# Patient Record
Sex: Male | Born: 1985 | Race: Black or African American | Hispanic: No | Marital: Single | Smoking: Current some day smoker
Health system: Southern US, Community
[De-identification: ages and names within clinical notes are randomized; demographics above are authoritative.]

## PROBLEM LIST (undated history)

## (undated) DIAGNOSIS — G40909 Epilepsy, unspecified, not intractable, without status epilepticus: Secondary | ICD-10-CM

## (undated) DIAGNOSIS — R569 Unspecified convulsions: Secondary | ICD-10-CM

## (undated) DIAGNOSIS — J45909 Unspecified asthma, uncomplicated: Secondary | ICD-10-CM

## (undated) HISTORY — DX: Epilepsy, unspecified, not intractable, without status epilepticus: G40.909

## (undated) HISTORY — PX: TONSILLECTOMY: SUR1361

## (undated) HISTORY — DX: Unspecified asthma, uncomplicated: J45.909

---
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
## 2003-06-02 ENCOUNTER — Emergency Department (HOSPITAL_COMMUNITY): Admission: EM | Admit: 2003-06-02 | Discharge: 2003-06-02 | Payer: Self-pay | Admitting: Emergency Medicine

## 2005-12-06 ENCOUNTER — Emergency Department (HOSPITAL_COMMUNITY): Admission: EM | Admit: 2005-12-06 | Discharge: 2005-12-06 | Payer: Self-pay | Admitting: Emergency Medicine

## 2007-04-18 ENCOUNTER — Emergency Department (HOSPITAL_COMMUNITY): Admission: EM | Admit: 2007-04-18 | Discharge: 2007-04-18 | Payer: Self-pay | Admitting: Emergency Medicine

## 2007-09-23 ENCOUNTER — Emergency Department (HOSPITAL_COMMUNITY): Admission: EM | Admit: 2007-09-23 | Discharge: 2007-09-23 | Payer: Self-pay | Admitting: Emergency Medicine

## 2007-10-04 ENCOUNTER — Emergency Department (HOSPITAL_COMMUNITY): Admission: EM | Admit: 2007-10-04 | Discharge: 2007-10-04 | Payer: Self-pay | Admitting: Emergency Medicine

## 2008-05-17 ENCOUNTER — Emergency Department (HOSPITAL_COMMUNITY): Admission: EM | Admit: 2008-05-17 | Discharge: 2008-05-17 | Payer: Self-pay | Admitting: Emergency Medicine

## 2009-07-02 ENCOUNTER — Emergency Department (HOSPITAL_COMMUNITY): Admission: EM | Admit: 2009-07-02 | Discharge: 2009-07-02 | Payer: Self-pay | Admitting: Emergency Medicine

## 2009-09-23 ENCOUNTER — Emergency Department (HOSPITAL_COMMUNITY): Admission: EM | Admit: 2009-09-23 | Discharge: 2009-09-23 | Payer: Self-pay | Admitting: Emergency Medicine

## 2010-01-06 ENCOUNTER — Emergency Department (HOSPITAL_COMMUNITY): Admission: EM | Admit: 2010-01-06 | Discharge: 2010-01-06 | Payer: Self-pay | Admitting: Emergency Medicine

## 2010-02-21 ENCOUNTER — Emergency Department (HOSPITAL_COMMUNITY): Admission: EM | Admit: 2010-02-21 | Discharge: 2010-02-21 | Payer: Self-pay | Admitting: Emergency Medicine

## 2010-08-27 ENCOUNTER — Emergency Department (HOSPITAL_COMMUNITY): Admission: EM | Admit: 2010-08-27 | Discharge: 2010-08-27 | Payer: Self-pay | Admitting: Specialist

## 2011-01-27 LAB — CBC
HCT: 41.3 % (ref 39.0–52.0)
Hemoglobin: 14.1 g/dL (ref 13.0–17.0)
MCHC: 34.2 g/dL (ref 30.0–36.0)
MCV: 91.5 fL (ref 78.0–100.0)
Platelets: 141 10*3/uL — ABNORMAL LOW (ref 150–400)
RBC: 4.52 MIL/uL (ref 4.22–5.81)
RDW: 13.2 % (ref 11.5–15.5)
WBC: 4 10*3/uL (ref 4.0–10.5)

## 2011-01-27 LAB — BASIC METABOLIC PANEL
BUN: 12 mg/dL (ref 6–23)
CO2: 28 mEq/L (ref 19–32)
Calcium: 9.1 mg/dL (ref 8.4–10.5)
Chloride: 104 mEq/L (ref 96–112)
Creatinine, Ser: 0.75 mg/dL (ref 0.4–1.5)
GFR calc Af Amer: >60 mL/min (ref >60)
GFR calc non Af Amer: >60 mL/min (ref >60)
Glucose, Bld: 100 mg/dL — ABNORMAL HIGH (ref 70–99)
Potassium: 4.7 mEq/L (ref 3.5–5.1)
Sodium: 137 mEq/L (ref 135–145)

## 2011-01-27 LAB — DIFFERENTIAL
Basophils Absolute: 0 10*3/uL (ref 0.0–0.1)
Basophils Relative: 1 % (ref 0–1)
Eosinophils Absolute: 0 10*3/uL (ref 0.0–0.7)
Eosinophils Relative: 1 % (ref 0–5)
Lymphocytes Relative: 44 % (ref 12–46)
Lymphs Abs: 1.7 10*3/uL (ref 0.7–4.0)
Monocytes Absolute: 0.6 10*3/uL (ref 0.1–1.0)
Monocytes Relative: 14 % — ABNORMAL HIGH (ref 3–12)
Neutro Abs: 1.6 10*3/uL — ABNORMAL LOW (ref 1.7–7.7)
Neutrophils Relative %: 41 % — ABNORMAL LOW (ref 43–77)

## 2011-01-27 LAB — VALPROIC ACID LEVEL: Valproic Acid Lvl: <10 ug/mL — ABNORMAL LOW (ref 50.0–100.0)

## 2011-02-11 LAB — VALPROIC ACID LEVEL: Valproic Acid Lvl: 61.8 ug/mL (ref 50.0–100.0)

## 2011-08-03 LAB — VALPROIC ACID LEVEL: Valproic Acid Lvl: <10 — ABNORMAL LOW

## 2011-08-03 LAB — POCT I-STAT, CHEM 8
HCT: 46
Hemoglobin: 15.6
Potassium: 4.1
Sodium: 141
TCO2: 26

## 2011-08-15 LAB — I-STAT 8, (EC8 V) (CONVERTED LAB)
Acid-base deficit: 1
Bicarbonate: 25.7 — ABNORMAL HIGH
HCT: 44
Operator id: 234501
pCO2, Ven: 47.4
pH, Ven: 7.341 — ABNORMAL HIGH

## 2011-08-15 LAB — POCT I-STAT CREATININE: Creatinine, Ser: 0.9

## 2011-08-15 LAB — VALPROIC ACID LEVEL: Valproic Acid Lvl: 88.5

## 2011-08-24 LAB — I-STAT 8, (EC8 V) (CONVERTED LAB)
Acid-base deficit: 1
Chloride: 106
HCT: 47
Operator id: 234111
Potassium: 3.9
TCO2: 28
pCO2, Ven: 52.6 — ABNORMAL HIGH
pH, Ven: 7.311 — ABNORMAL HIGH

## 2011-08-24 LAB — POCT I-STAT CREATININE
Creatinine, Ser: 0.9
Operator id: 234111

## 2012-01-26 ENCOUNTER — Emergency Department (HOSPITAL_COMMUNITY)
Admission: EM | Admit: 2012-01-26 | Discharge: 2012-01-26 | Disposition: A | Discharge status: Home / Self Care | Payer: Self-pay | Attending: Emergency Medicine | Admitting: Emergency Medicine

## 2012-01-26 ENCOUNTER — Encounter (HOSPITAL_COMMUNITY): Payer: Self-pay | Admitting: Emergency Medicine

## 2012-01-26 DIAGNOSIS — R569 Unspecified convulsions: Secondary | ICD-10-CM | POA: Insufficient documentation

## 2012-01-26 DIAGNOSIS — F29 Unspecified psychosis not due to a substance or known physiological condition: Secondary | ICD-10-CM | POA: Insufficient documentation

## 2012-01-26 DIAGNOSIS — F17200 Nicotine dependence, unspecified, uncomplicated: Secondary | ICD-10-CM | POA: Insufficient documentation

## 2012-01-26 LAB — VALPROIC ACID LEVEL: Valproic Acid Lvl: 57.2 ug/mL (ref 50.0–100.0)

## 2012-01-26 NOTE — ED Provider Notes (Signed)
History     CSN: [id number]  Arrival date & time 01/26/12  1230   First MD Initiated Contact with Patient 01/26/12 1305      Chief Complaint  Patient presents with  . 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 Witness states patient had head jerking which generalized into full body shaking. Witness stated patient had two seizures separated by one minute. + postictal state. No urinary or bowel incontinence. Last seizure two years ago. Takes depakote daily and has been taking. Patient drinks occasional ETOH, two beers last night. No injuries or other medical complaints.   Patient is a 26 y.o. male presenting with seizures. The history is provided by the patient.  Seizures  This is a recurrent problem. The current episode started less than 1 hour ago. There were 2 to 3 seizures. The most recent episode lasted 2 to 5 minutes. Associated symptoms include confusion. Pertinent negatives include no headaches, no neck stiffness, no sore throat, no chest pain, no cough, no nausea, no vomiting and no diarrhea. Characteristics include eye blinking, rhythmic jerking and loss of consciousness. Characteristics do not include bladder incontinence or bit tongue. The episode was witnessed. The seizures did not continue in the ED. There has been no fever.    Past Medical History  Diagnosis Date  . Seizures     Past Surgical History  Procedure Date  . Tonsillectomy     History reviewed. No pertinent family history.  History  Substance Use Topics  . Smoking status: Current Everyday Smoker -- 0.5 packs/day    Types: Cigarettes  . Smokeless tobacco: Not on file  . Alcohol Use:      socially      Review of Systems  Constitutional: Negative for fever.  HENT: Negative for sore throat and rhinorrhea.     Eyes: Negative for redness.  Respiratory: Negative for cough.   Cardiovascular: Negative for chest pain.  Gastrointestinal: Negative for nausea, vomiting, abdominal pain and diarrhea.  Genitourinary: Negative for bladder incontinence and dysuria.  Musculoskeletal: Negative for myalgias.  Skin: Negative for rash.  Neurological: Positive for seizures and loss of consciousness. Negative for headaches.  Psychiatric/Behavioral: Positive for confusion.    Allergies  Review of patient's allergies indicates no known allergies.  Home Medications   Current Outpatient Rx  Name Route Sig Dispense Refill  . DIVALPROEX SODIUM ER 500 MG PO TB24 Oral Take 2,000 mg by mouth daily.      BP 143/81  Pulse 105  Temp(Src) 98.6 F (37 C) (Oral)  Resp 21  SpO2 96%  Physical Exam  Nursing note and vitals reviewed. Constitutional: He is oriented to person, place, and time. He appears well-developed and well-nourished.  HENT:  Head: Normocephalic and atraumatic.  Eyes: Conjunctivae are normal. Right eye exhibits no discharge. Left eye exhibits no discharge.  Neck: Normal range of motion. Neck supple.  Cardiovascular: Normal rate, regular rhythm and normal heart sounds.   Pulmonary/Chest: Effort normal and breath sounds normal.  Abdominal: Soft. There is no tenderness.  Neurological: He is alert and oriented to person, place, and time. He has normal strength. No cranial nerve deficit or sensory deficit. Coordination normal. GCS eye subscore is 4. GCS verbal subscore is 5. GCS motor subscore is 6.  Skin: Skin is warm and dry.  Psychiatric: He has a normal mood and affect.    ED  Course  Procedures (including critical care time)   Labs Reviewed  VALPROIC ACID LEVEL  VALPROIC ACID LEVEL   No results found.   1. Seizure     1:00PM Patient seen and examined. Work-up initiated.    Vital signs reviewed and are as follows: Filed Vitals:   01/26/12 1238  BP: 143/81  Pulse: 105  Temp: 98.6  F (37 C)  Resp: 21   3:09 PM Patient resting in room. No additional seizure activity.   6:42 PM Depakote level is normal. Patient informed. There was a delay in test result because first sample was hemolyzed. Patient instructed to take depakote as he normally does. Urged neurologist follow-up. Instructed not to drive.   MDM  Patient with seizure disorder. Typical seizure. No head injury. Depakote level normal. Discharge in stable condition.         [place], [place] 01/26/12 [phone]

## 2012-01-26 NOTE — Progress Notes (Signed)
not interested in [facility] service prefers to have neurologist as pcp

## 2012-01-26 NOTE — Discharge Instructions (Signed)
Please read and follow all provided instructions.  Your diagnoses today include:  1. Seizure     Tests performed today include:  Depakote level - showed normal depakote level  Vital signs. See below for your results today.   Medications prescribed:   None  Take any prescribed medications only as directed.  Home care instructions:  Follow any educational materials contained in this packet.  BE VERY CAREFUL not to take multiple medicines containing Tylenol (also called acetaminophen). Doing so can lead to an overdose which can damage your liver and cause liver failure and possibly death.   Follow-up instructions: Please follow-up with your primary care provider in the next 1 week for further evaluation of your symptoms. If you do not have a primary care doctor -- see below for referral information.   Return instructions:   Please return to the Emergency Department if you experience worsening symptoms.   Please return if you have any other emergent concerns.  Additional Information:  Your vital signs today were: BP 143/81  Pulse 105  Temp(Src) 98.6 F (37 C) (Oral)  Resp 21  SpO2 96% If your blood pressure (BP) was elevated above 135/85 this visit, please have this repeated by your doctor within one month. -------------- No Primary Care Doctor Call [facility]  [phone] Other agencies that provide inexpensive medical care    [facility]  [phone]    [facility]  [phone]    [facility]  [phone]    [facility]  [phone]    [facility]  [phone]    [facility]  [phone] -------------- RESOURCE GUIDE:  Dental Problems  Patients with Medicaid: [facility] [phone]                                            [phone] [address] [employer] [address] Phone:  [phone]                                                   Phone:  [phone]  If unable to pay or  uninsured, contact:  [facility] or [facility]. to become qualified for the adult dental clinic.  Chronic Pain Problems Contact [facility]  [phone] Patients need to be referred by their primary care doctor.  Insufficient Money for Medicine Contact [employer]:  call "211" or [facility] [phone].  Psychological Services [facility]  [phone] [facility]  [phone] [facility]   [phone] (emergency services [phone])  Substance Abuse Resources [facility]  [phone] [facility] [phone] [employer] [phone] Floydene Flock [phone] Residential & Outpatient Substance Abuse Program  [phone]  Abuse/Neglect [facility] Child Abuse Hotline [phone] 620-7387 [facility] Child Abuse Hotline [phone] 417-6701 (After Hours)  Emergency Shelter [facility] [phone]  Maternity Homes Room at the [place] [facility] [place] [phone] [facility] [phone]  [facility]  [facility] [address] [place]                       [address]      [address]  [place]                                                [place]                            [place] Phone:  [phone]                                   Phone:  [phone]                 Phone:  [phone]  [facility] Phone:  [phone]  [place] Child Abuse Hotline [phone]) 342-1394 [phone] (After Hours)    

## 2012-01-26 NOTE — ED Notes (Signed)
CALLED [facility] TO CHECK ON VALPORIC ACID LEVEL

## 2012-01-26 NOTE — Progress Notes (Signed)
Pt states he primarily sees his neurologist in [place] Having difficulty recalling the name but believes it is Dr Lalla Brothers he does not have a pcp

## 2012-01-26 NOTE — ED Notes (Signed)
To ed via [facility] with c/o seizure x2 this am. Hx of seizures since 26y/o. (22 yrs ago) from head injury. Last seizure 2 yrs ago.

## 2012-01-26 NOTE — ED Notes (Signed)
AVW:[id number]<XB> Expected date:<BR> Expected time:12:13 PM<BR> Means of arrival:<BR> Comments:<BR> M100 - 26yoM Seizure with hx

## 2012-01-29 NOTE — ED Provider Notes (Signed)
Medical screening examination/treatment/procedure(s) were performed by non-physician practitioner and as supervising physician I was immediately available for consultation/collaboration.   Benny Lennert, MD 01/29/12 1113

## 2013-01-22 ENCOUNTER — Encounter (HOSPITAL_COMMUNITY): Payer: Self-pay | Admitting: *Deleted

## 2013-01-22 ENCOUNTER — Emergency Department (HOSPITAL_COMMUNITY)
Admission: EM | Admit: 2013-01-22 | Discharge: 2013-01-22 | Disposition: A | Discharge status: Home / Self Care | Payer: Self-pay | Attending: Emergency Medicine | Admitting: Emergency Medicine

## 2013-01-22 DIAGNOSIS — J069 Acute upper respiratory infection, unspecified: Secondary | ICD-10-CM | POA: Insufficient documentation

## 2013-01-22 DIAGNOSIS — G40909 Epilepsy, unspecified, not intractable, without status epilepticus: Secondary | ICD-10-CM | POA: Insufficient documentation

## 2013-01-22 DIAGNOSIS — F17200 Nicotine dependence, unspecified, uncomplicated: Secondary | ICD-10-CM | POA: Insufficient documentation

## 2013-01-22 DIAGNOSIS — J3489 Other specified disorders of nose and nasal sinuses: Secondary | ICD-10-CM | POA: Insufficient documentation

## 2013-01-22 DIAGNOSIS — IMO0001 Reserved for inherently not codable concepts without codable children: Secondary | ICD-10-CM | POA: Insufficient documentation

## 2013-01-22 NOTE — ED Notes (Signed)
Pt states has a sore throat, body aches, and congestion/"I can't breathe". Pt states he doesn't know if he's had a fever.

## 2013-01-22 NOTE — ED Provider Notes (Signed)
History    This chart was scribed for non-physician practitioner working with Celene Kras, MD by Leone Payor, ED Scribe. This patient was seen in room WTR6/WTR6 and the patient's care was started at 1832.   CSN: [id number]  Arrival date & time 01/22/13  7829   First MD Initiated Contact with Patient 01/22/13 1850      Chief Complaint  Patient presents with  . Sore Throat  . Generalized Body Aches  . Nasal Congestion    The history is provided by the patient. No language interpreter was used.    Donald Travis is a 27 y.o. male who presents to the Emergency Department complaining of new, gradual onset, gradual worsening sore throat with associated body aches, cough and congestion starting 1 day ago. Pt states he does not know what color mucous he is coughing up. He denies taking any OTC medications at home. States he is drinking liquids less because it worsens his sore throat. He does not know of any sick contacts. He denies abdominal, nausea, chest pain, SOB, chills, ear pain. Pt takes Depakote daily.   Pt is a current everyday smoker and occasional alcohol user.  Past Medical History  Diagnosis Date  . Seizures     Past Surgical History  Procedure Laterality Date  . Tonsillectomy      History reviewed. No pertinent family history.  History  Substance Use Topics  . Smoking status: Current Some Day Smoker -- 0.50 packs/day    Types: Cigarettes  . Smokeless tobacco: Never Used  . Alcohol Use: Yes     Comment: socially      Review of Systems A complete 10 system review of systems was obtained and all systems are negative except as noted in the HPI and PMH.    Allergies  Review of patient's allergies indicates no known allergies.  Home Medications   Current Outpatient Rx  Name  Route  Sig  Dispense  Refill  . divalproex (DEPAKOTE ER) 500 MG 24 hr tablet   Oral   Take 2,000 mg by mouth daily.         . Multiple Vitamin (MULTIVITAMIN WITH MINERALS) TABS    Oral   Take 1 tablet by mouth daily.           BP 128/82  Pulse 100  Temp(Src) 98.6 F (37 C) (Oral)  Resp 23  SpO2 98%  Physical Exam  Nursing note and vitals reviewed. Constitutional: He is oriented to person, place, and time. He appears well-developed and well-nourished. No distress.  HENT:  Head: Normocephalic and atraumatic.  Right Ear: Tympanic membrane and external ear normal.  Left Ear: Tympanic membrane and external ear normal.  Nose: Mucosal edema present.  Mouth/Throat: Oropharynx is clear and moist. No oropharyngeal exudate.  Eyes: EOM are normal.  Neck: Neck supple. No tracheal deviation present.  Cardiovascular: Normal rate, regular rhythm and normal heart sounds.   Pulmonary/Chest: Effort normal and breath sounds normal. No respiratory distress. He has no wheezes.  Abdominal: Soft. Bowel sounds are normal. He exhibits no distension and no mass. There is no tenderness. There is no rebound and no guarding.  Musculoskeletal: Normal range of motion.  Lymphadenopathy:    He has no cervical adenopathy.  Neurological: He is alert and oriented to person, place, and time.  Skin: Skin is warm and dry.  Psychiatric: He has a normal mood and affect. His behavior is normal.    ED Course  Procedures (including critical care time)  DIAGNOSTIC STUDIES: Oxygen Saturation is 98% on room air, normal by my interpretation.    COORDINATION OF CARE: 6:55 PM Discussed treatment plan with pt at bedside and pt agreed to plan.    Labs Reviewed - No data to display No results found.   1. URI (upper respiratory infection)       MDM  Cough with nasal congestion and sore throat. No dyspnea.  No fever, no n/v/d, abdominal pain.  Pt has not tried anything for symptom relief.    Nasal membranes: erythremic, clear bilateral nasal discharge   Lungs: CTAB   Not concerned for pneumonia or bronchitis at this time.   Believe this is viral URI.   Instructed pt to try some OTC  tylenol cold and sinus with nasal saline rinses.  May use cough drops to sooth sore throat.  F/u with PCP or urgent care if not improving.  Return to ED if difficulty breathing, develop fever uncontrolled by medications.   Vitals: unremarkable. Discharged in stable condition.    Discussed pt with supervisore during ED encounter.  I personally performed the services described in this documentation, which was scribed in my presence. The recorded information has been reviewed and is accurate.   Junius Finner, PA-C 01/22/13 1932

## 2013-01-22 NOTE — ED Provider Notes (Signed)
Medical screening examination/treatment/procedure(s) were performed by non-physician practitioner and as supervising physician I was immediately available for consultation/collaboration.   Celene Kras, MD 01/22/13 [phone]

## 2014-09-28 ENCOUNTER — Emergency Department (HOSPITAL_COMMUNITY)
Admission: EM | Admit: 2014-09-28 | Discharge: 2014-09-29 | Disposition: A | Discharge status: Home / Self Care | Payer: Self-pay | Attending: Emergency Medicine | Admitting: Emergency Medicine

## 2014-09-28 ENCOUNTER — Encounter (HOSPITAL_COMMUNITY): Payer: Self-pay | Admitting: Emergency Medicine

## 2014-09-28 DIAGNOSIS — Z720 Tobacco use: Secondary | ICD-10-CM | POA: Insufficient documentation

## 2014-09-28 DIAGNOSIS — G40909 Epilepsy, unspecified, not intractable, without status epilepticus: Secondary | ICD-10-CM | POA: Insufficient documentation

## 2014-09-28 DIAGNOSIS — R569 Unspecified convulsions: Secondary | ICD-10-CM

## 2014-09-28 DIAGNOSIS — Z79899 Other long term (current) drug therapy: Secondary | ICD-10-CM | POA: Insufficient documentation

## 2014-09-28 LAB — BASIC METABOLIC PANEL
ANION GAP: 19 — AB (ref 5–15)
BUN: 14 mg/dL (ref 6–23)
CALCIUM: 9.5 mg/dL (ref 8.4–10.5)
CO2: 20 mEq/L (ref 19–32)
CREATININE: 0.99 mg/dL (ref 0.50–1.35)
Chloride: 102 mEq/L (ref 96–112)
GFR calc non Af Amer: >90 mL/min (ref >90)
Glucose, Bld: 140 mg/dL — ABNORMAL HIGH (ref 70–99)
Potassium: 4.7 mEq/L (ref 3.7–5.3)
Sodium: 141 mEq/L (ref 137–147)

## 2014-09-28 LAB — CBG MONITORING, ED: GLUCOSE-CAPILLARY: 138 mg/dL — AB (ref 70–99)

## 2014-09-28 LAB — VALPROIC ACID LEVEL

## 2014-09-28 MED ORDER — VALPROATE SODIUM 500 MG/5ML IV SOLN
500.0000 mg | Freq: Once | INTRAVENOUS | Status: AC
  Administered 2014-09-28: 500 mg via INTRAVENOUS
  Filled 2014-09-28: qty 5

## 2014-09-28 MED ORDER — LORAZEPAM 2 MG/ML IJ SOLN: 1.0000 mg | Freq: Once | INTRAMUSCULAR | Status: DC

## 2014-09-28 NOTE — ED Notes (Signed)
Pt states he was sitting in a booth and had a seizure tonight. Girlfriend witnessed x 1 minute. Post ictal on arrival per EMS. Forgot to take depakote this morning. Alert and oriented now.

## 2014-09-28 NOTE — ED Notes (Signed)
Bed: WA18 Expected date:  Expected time:  Means of arrival:  Comments: Ems  

## 2014-09-28 NOTE — ED Provider Notes (Signed)
CSN: [id number]     Arrival date & time 09/28/14  2050 History   First MD Initiated Contact with Patient 09/28/14 2236     Chief Complaint  Patient presents with  . 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 He has been noncompliant with his Depakote 2 days. No recent illnesses noted. Last seizure was 2 years ago. He did not have any tongue injury or loss of bladder function. He was postictal initially but as they're improving. No treatment use prior to arrival. Nothing made her symptoms better or worse.  Patient is a 28 y.o. male presenting with seizures. The history is provided by the patient and a parent.  Seizures   Past Medical History  Diagnosis Date  . Seizures    Past Surgical History  Procedure Laterality Date  . Tonsillectomy     History reviewed. No pertinent family history. History  Substance Use Topics  . Smoking status: Current Some Day Smoker -- 0.50 packs/day    Types: Cigarettes  . Smokeless tobacco: Never Used  . Alcohol Use: Yes     Comment: socially    Review of Systems  Neurological: Positive for seizures.  All other systems reviewed and are negative.     Allergies  Review of patient's allergies indicates no known allergies.  Home Medications   Prior to Admission medications   Medication Sig Start Date End Date Taking? Authorizing Provider  divalproex (DEPAKOTE ER) 500 MG 24 hr tablet Take 2,000 mg by mouth daily.   Yes Historical Provider, MD  Multiple Vitamin (MULTIVITAMIN WITH MINERALS) TABS Take 1 tablet by mouth daily.    Historical Provider, MD   BP 144/89 mmHg  Pulse 129  Temp(Src) 98.8 F (37.1 C) (Oral)  Resp 23  SpO2 95% Physical Exam  Constitutional: He is oriented to person, place, and time. He appears well-developed and  well-nourished.  Non-toxic appearance. No distress.  HENT:  Head: Normocephalic and atraumatic.  Eyes: Conjunctivae, EOM and lids are normal. Pupils are equal, round, and reactive to light.  Neck: Normal range of motion. Neck supple. No tracheal deviation present. No thyroid mass present.  Cardiovascular: Normal rate, regular rhythm and normal heart sounds.  Exam reveals no gallop.   No murmur heard. Pulmonary/Chest: Effort normal and breath sounds normal. No stridor. No respiratory distress. He has no decreased breath sounds. He has no wheezes. He has no rhonchi. He has no rales.  Abdominal: Soft. Normal appearance and bowel sounds are normal. He exhibits no distension. There is no tenderness. There is no rebound and no CVA tenderness.  Musculoskeletal: Normal range of motion. He exhibits no edema or tenderness.  Neurological: He is alert and oriented to person, place, and time. He has normal strength. No cranial nerve deficit or sensory deficit. GCS eye subscore is 4. GCS verbal subscore is 5. GCS motor subscore is 6.  Skin: Skin is warm and dry. No abrasion and no rash noted.  Psychiatric: He has a normal mood and affect. His speech is normal and behavior is normal.  Nursing note and vitals reviewed.   ED Course  Procedures (including critical care time) Labs Review Labs Reviewed  BASIC METABOLIC PANEL - Abnormal; Notable for the following:    Glucose, Bld 140 (*)    Anion gap 19 (*)    All other components within normal limits  CBG  MONITORING, ED - Abnormal; Notable for the following:    Glucose-Capillary 138 (*)    All other components within normal limits  VALPROIC ACID LEVEL  I-STAT CHEM 8, ED    Imaging Review No results found.   EKG Interpretation None      MDM   Final diagnoses:  None   Patient to be loaded with IV Depakote and  Will restart his normal Depakote at home    Toy BakerAnthony T Kaden Daughdrill, MD 09/28/14 2259

## 2014-09-28 NOTE — Progress Notes (Signed)
  CARE MANAGEMENT ED NOTE 09/28/2014  Patient:  Donald Travis,Donald Travis   Account Number:  [id number]  Date Initiated:  09/28/2014  Documentation initiated by:  Radford PaxFERRERO,Taji Sather  Subjective/Objective Assessment:   Patient presents to Edpost seizure     Subjective/Objective Assessment Detail:   Patient with pmhx of seizures     Action/Plan:   Action/Plan Detail:   Anticipated DC Date:       Status Recommendation to Physician:   Result of Recommendation:    Other ED Services  Consult Working Plan    DC Planning Services  Other  PCP issues    Choice offered to / List presented to:            Status of service:  Completed, signed off  ED Comments:   ED Comments Detail:  EDCM spoke to patient and his father at bedside.  Patient reports [place] neurolog[place]. Patient unable to give any further inofrmation on his neurologist.  Patient is employed.  Patient confirms he does not have a pcp or insurance [place]. Resources given to patient's father as patient fell asleep. EDCM provide patient with pamphlet to [facility], informed patient of services there and walk in times.  EDCM also provided patient with list of pcps who accept self pay patients, list of discount pharmacies and websites needymeds.org and GoodRX.com for medication assistance, phone number to inquire about the orange card, phone number to inquire about Mediciad, phone number to inquire about the Affordable Care Act, financial resources in the community such as local churches, [employer], urban ministries, and dental assistance for uninsured patients. Patient's father thankful for resources.  No further EDCM needs at this time.

## 2014-09-28 NOTE — Discharge Instructions (Signed)
Take your depakote as directed Epilepsy Epilepsy is a disorder in which a person has repeated seizures over time. A seizure is a release of abnormal electrical activity in the brain. Seizures can cause a change in attention, behavior, or the ability to remain awake and alert (altered mental status). Seizures often involve uncontrollable shaking (convulsions).  Most people with epilepsy lead normal lives. However, people with epilepsy are at an increased risk of falls, accidents, and injuries. Therefore, it is important to begin treatment right away. CAUSES  Epilepsy has many possible causes. Anything that disturbs the normal pattern of brain cell activity can lead to seizures. This may include:   Head injury.  Birth trauma.  High fever as a child.  Stroke.  Bleeding into or around the brain.  Certain drugs.  Prolonged low oxygen, such as what occurs after CPR efforts.  Abnormal brain development.  Certain illnesses, such as meningitis, encephalitis (brain infection), malaria, and other infections.  An imbalance of nerve signaling chemicals (neurotransmitters).  SIGNS AND SYMPTOMS  The symptoms of a seizure can vary greatly from one person to another. Right before a seizure, you may have a warning (aura) that a seizure is about to occur. An aura may include the following symptoms:  Fear or anxiety.  Nausea.  Feeling like the room is spinning (vertigo).  Vision changes, such as seeing flashing lights or spots. Common symptoms during a seizure include:  Abnormal sensations, such as an abnormal smell or a bitter taste in the mouth.   Sudden, general body stiffness.   Convulsions that involve rhythmic jerking of the face, arm, or leg on one or both sides.   Sudden change in consciousness.   Appearing to be awake but not responding.   Appearing to be asleep but cannot be awakened.   Grimacing, chewing, lip smacking, drooling, tongue biting, or loss of bowel or  bladder control. After a seizure, you may feel sleepy for a while. DIAGNOSIS  Your health care provider will ask about your symptoms and take a medical history. Descriptions from any witnesses to your seizures will be very helpful in the diagnosis. A physical exam, including a detailed neurological exam, is necessary. Various tests may be done, such as:   An electroencephalogram (EEG). This is a painless test of your brain waves. In this test, a diagram is created of your brain waves. These diagrams can be interpreted by a specialist.  An MRI of the brain.   A CT scan of the brain.   A spinal tap (lumbar puncture, LP).  Blood tests to check for signs of infection or abnormal blood chemistry. TREATMENT  There is no cure for epilepsy, but it is generally treatable. Once epilepsy is diagnosed, it is important to begin treatment as soon as possible. For most people with epilepsy, seizures can be controlled with medicines. The following may also be used:  A pacemaker for the brain (vagus nerve stimulator) can be used for people with seizures that are not well controlled by medicine.  Surgery on the brain. For some people, epilepsy eventually goes away. HOME CARE INSTRUCTIONS   Follow your health care provider's recommendations on driving and safety in normal activities.  Get enough rest. Lack of sleep can cause seizures.  Only take over-the-counter or prescription medicines as directed by your health care provider. Take any prescribed medicine exactly as directed.  Avoid any known triggers of your seizures.  Keep a seizure diary. Record what you recall about any seizure, especially any  possible trigger.   Make sure the people you live and work with know that you are prone to seizures. They should receive instructions on how to help you. In general, a witness to a seizure should:   Cushion your head and body.   Turn you on your side.   Avoid unnecessarily restraining you.    Not place anything inside your mouth.   Call for emergency medical help if there is any question about what has occurred.   Follow up with your health care provider as directed. You may need regular blood tests to monitor the levels of your medicine.  SEEK MEDICAL CARE IF:   You develop signs of infection or other illness. This might increase the risk of a seizure.   You seem to be having more frequent seizures.   Your seizure pattern is changing.  SEEK IMMEDIATE MEDICAL CARE IF:   You have a seizure that does not stop after a few moments.   You have a seizure that causes any difficulty in breathing.   You have a seizure that results in a very severe headache.   You have a seizure that leaves you with the inability to speak or use a part of your body.  Document Released: 10/23/2005 Document Revised: 08/13/2013 Document Reviewed: 06/04/2013 [facility] Patient Information 2015 [place]. This information is not intended to replace advice given to you by your health care provider. Make sure you discuss any questions you have with your health care provider.

## 2014-10-07 ENCOUNTER — Emergency Department (HOSPITAL_COMMUNITY)
Admission: EM | Admit: 2014-10-07 | Discharge: 2014-10-07 | Disposition: A | Discharge status: Home / Self Care | Payer: Self-pay | Attending: Emergency Medicine | Admitting: Emergency Medicine

## 2014-10-07 ENCOUNTER — Encounter (HOSPITAL_COMMUNITY): Payer: Self-pay

## 2014-10-07 DIAGNOSIS — Z8669 Personal history of other diseases of the nervous system and sense organs: Secondary | ICD-10-CM | POA: Insufficient documentation

## 2014-10-07 DIAGNOSIS — Z720 Tobacco use: Secondary | ICD-10-CM | POA: Insufficient documentation

## 2014-10-07 DIAGNOSIS — Z79899 Other long term (current) drug therapy: Secondary | ICD-10-CM | POA: Insufficient documentation

## 2014-10-07 DIAGNOSIS — B349 Viral infection, unspecified: Secondary | ICD-10-CM | POA: Insufficient documentation

## 2014-10-07 MED ORDER — ALBUTEROL SULFATE HFA 108 (90 BASE) MCG/ACT IN AERS: 1.0000 | INHALATION_SPRAY | Freq: Four times a day (QID) | RESPIRATORY_TRACT | Status: DC | PRN

## 2014-10-07 MED ORDER — ONDANSETRON 4 MG PO TBDP: 4.0000 mg | ORAL_TABLET | ORAL | Status: DC | PRN

## 2014-10-07 NOTE — ED Provider Notes (Signed)
CSN: [id number]     Arrival date & time 10/07/14  1739 History   First MD Initiated Contact with Patient 10/07/14 1748     Chief Complaint  Patient presents with  . Emesis  . Cough  . Headache     (Consider location/radiation/quality/duration/timing/severity/associated sxs/prior Treatment) HPI The patient has 4 days of intermittent nausea and vomiting. He reports several episodes of vomiting each day. He also reports several episodes of diarrhea. In association with this patient has a sore throat and he reports cough without shortness of breath or chest pain. He reports the cough is dry in nature. He also reports generalized headache. The patient reports he had a fever yesterday to 102. Patient has a history of asthma as a child but has not had any wheezing or respiratory distress. Past Medical History  Diagnosis Date  . Seizures    Past Surgical History  Procedure Laterality Date  . Tonsillectomy     History reviewed. No pertinent family history. History  Substance Use Topics  . Smoking status: Current Some Day Smoker -- 0.50 packs/day    Types: Cigarettes  . Smokeless tobacco: Never Used  . Alcohol Use: Yes     Comment: socially    Review of Systems 10 Systems reviewed and are negative for acute change except as noted in the HPI.    Allergies  Review of patient's allergies indicates no known allergies.  Home Medications   Prior to Admission medications   Medication Sig Start Date End Date Taking? Authorizing Provider  divalproex (DEPAKOTE ER) 500 MG 24 hr tablet Take 2,000 mg by mouth daily.   Yes Historical Provider, MD  Multiple Vitamin (MULTIVITAMIN WITH MINERALS) TABS Take 1 tablet by mouth daily.   Yes Historical Provider, MD  albuterol (PROVENTIL HFA;VENTOLIN HFA) 108 (90 BASE) MCG/ACT inhaler Inhale 1-2 puffs into the lungs every 6 (six) hours as needed for wheezing or shortness of breath. 10/07/14   Arby BarretteMarcy Yesennia Hirota, MD  ondansetron (ZOFRAN ODT) 4 MG  disintegrating tablet Take 1 tablet (4 mg total) by mouth every 4 (four) hours as needed for nausea or vomiting. 10/07/14   Arby BarretteMarcy Carsten Carstarphen, MD   BP 148/97 mmHg  Pulse 94  Temp(Src) 98.9 F (37.2 C) (Oral)  Resp 16  SpO2 95% Physical Exam  Constitutional: He is oriented to person, place, and time. He appears well-developed and well-nourished.  HENT:  Head: Normocephalic and atraumatic.  Eyes: EOM are normal. Pupils are equal, round, and reactive to light.  Neck: Neck supple.  Cardiovascular: Normal rate, regular rhythm, normal heart sounds and intact distal pulses.   Pulmonary/Chest: Effort normal and breath sounds normal.  Abdominal: Soft. Bowel sounds are normal. He exhibits no distension. There is no tenderness.  Musculoskeletal: Normal range of motion. He exhibits no edema.  Neurological: He is alert and oriented to person, place, and time. He has normal strength. Coordination normal. GCS eye subscore is 4. GCS verbal subscore is 5. GCS motor subscore is 6.  Skin: Skin is warm, dry and intact.  Psychiatric: He has a normal mood and affect.    ED Course  Procedures (including critical care time) Labs Review Labs Reviewed - No data to display  Imaging Review No results found.   EKG Interpretation None      MDM   Final diagnoses:  Viral syndrome   The patient presents with multiple symptoms consistent with a viral syndrome. He is well in appearance. His throat does not have any exudate or tonsillar swelling.  He has no meningismus. His lungs are clear and he has no respiratory distress. His abdomen is soft area and at this point he will be treated symptomatically.    Arby BarretteMarcy Livingston Denner, MD 10/07/14 [phone]

## 2014-10-07 NOTE — ED Notes (Addendum)
Pt c/o n/v/d x 4 days and sore throat, cough, congestion, and headache x 1 days.  Pain score 5/10.  Pt reports taking OTC medication with little relief.

## 2014-10-07 NOTE — Discharge Instructions (Signed)

## 2014-10-07 NOTE — ED Notes (Signed)
MD at bedside. 

## 2014-10-09 NOTE — ED Notes (Signed)
Opened chart today for work note.  Per Langston MaskerKaren Sofia, PA ok to give work note allowing return to work on 12/4

## 2014-11-27 ENCOUNTER — Ambulatory Visit: Payer: Self-pay | Admitting: Family Medicine

## 2014-12-03 ENCOUNTER — Ambulatory Visit: Payer: Self-pay | Attending: Family Medicine | Admitting: Family Medicine

## 2014-12-03 ENCOUNTER — Encounter: Payer: Self-pay | Admitting: Family Medicine

## 2014-12-03 VITALS — BP 134/88 | HR 84 | Temp 98.0°F | Ht 69.0 in | Wt 247.0 lb

## 2014-12-03 DIAGNOSIS — Z720 Tobacco use: Secondary | ICD-10-CM

## 2014-12-03 DIAGNOSIS — G40309 Generalized idiopathic epilepsy and epileptic syndromes, not intractable, without status epilepticus: Secondary | ICD-10-CM | POA: Insufficient documentation

## 2014-12-03 DIAGNOSIS — G40802 Other epilepsy, not intractable, without status epilepticus: Secondary | ICD-10-CM

## 2014-12-03 DIAGNOSIS — F17200 Nicotine dependence, unspecified, uncomplicated: Secondary | ICD-10-CM | POA: Insufficient documentation

## 2014-12-03 LAB — COMPLETE METABOLIC PANEL WITH GFR
ALK PHOS: 40 U/L (ref 39–117)
ALT: 12 U/L (ref 0–53)
AST: 13 U/L (ref 0–37)
Albumin: 3.7 g/dL (ref 3.5–5.2)
BUN: 11 mg/dL (ref 6–23)
CALCIUM: 9.2 mg/dL (ref 8.4–10.5)
CHLORIDE: 103 meq/L (ref 96–112)
CO2: 29 mEq/L (ref 19–32)
CREATININE: 0.79 mg/dL (ref 0.50–1.35)
GFR, Est African American: >89 mL/min
GFR, Est Non African American: >89 mL/min
GLUCOSE: 106 mg/dL — AB (ref 70–99)
Potassium: 4.7 mEq/L (ref 3.5–5.3)
SODIUM: 140 meq/L (ref 135–145)
TOTAL PROTEIN: 6.5 g/dL (ref 6.0–8.3)
Total Bilirubin: 0.5 mg/dL (ref 0.2–1.2)

## 2014-12-03 MED ORDER — DIVALPROEX SODIUM ER 500 MG PO TB24: 2000.0000 mg | ORAL_TABLET | Freq: Every day | ORAL | Status: DC

## 2014-12-03 NOTE — Patient Instructions (Signed)
Mr. Donald Travis,   Thank you for coming in today. It was a pleasure meeting you. I look forward to being your primary doctor.   1. 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    2. Smoking: Smoking cessation support: smoking cessation hotline: 1-800-QUIT-NOW.  Smoking cessation classes are available through [facility]. Call [phone] or visit our website at [email or web address].  You will be called with lab results  Apply for PASS, [place] discount, orange card.   F/u in 3 months for seizures and smoking cessation  Dr. Armen PickupFunches

## 2014-12-03 NOTE — Assessment & Plan Note (Signed)
1. 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

## 2014-12-03 NOTE — Progress Notes (Signed)
Pt here to establish care. He states he is followed by [facility] Royston SinnerJoseph Miller [facility] [place].

## 2014-12-03 NOTE — Progress Notes (Signed)
   Subjective:    Patient ID: Donald Travis, male    DOB: Mar 17, 1986, 29 y.o.   MRN: [id number] CC: epilepsy  HPI 29 yo M NP:  1. 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 Last seizure in 2012.  [place]. Has trouble affording depakote at times.   2. Smoker: 1/2 PPD x 5 years. Ready to quit. Last cigarette 1 week ago. Planning to quit w/o medication assistance. Feels confident in his ability to quit.   Soc Hx: current smoker, 1/2 PPD Med Hx: seizures dx at age 29 Fam Hx: unknown adopted  Review of Systems As per HPI     Objective:   Physical Exam BP 134/88 mmHg  Pulse 84  Temp(Src) 98 F (36.7 C) (Oral)  Ht 5\' 9"  (1.753 m)  Wt 247 lb (112.038 kg)  BMI 36.46 kg/m2  SpO2 99% General appearance: alert, cooperative and no distress Lungs: clear to auscultation bilaterally Heart: regular rate and rhythm, S1, S2 normal, no murmur, click, rub or gallop Extremities: extremities normal, atraumatic, no cyanosis or edema    Assessment & Plan:

## 2014-12-04 LAB — VALPROIC ACID LEVEL: VALPROIC ACID LVL: 32 ug/mL — AB (ref 50.0–100.0)

## 2014-12-08 ENCOUNTER — Ambulatory Visit: Payer: Self-pay | Attending: Internal Medicine

## 2014-12-08 ENCOUNTER — Telehealth: Payer: Self-pay | Admitting: *Deleted

## 2014-12-08 NOTE — Telephone Encounter (Signed)
Pt aware of lab results 

## 2014-12-08 NOTE — Telephone Encounter (Signed)
-----   Message from Lora PaulaJosalyn C Funches, MD sent at 12/04/2014  8:53 AM EST ----- depakote level is still a bit low but much higher than 2 months ago. Since seizure free, continue current depakote dose.  Normal CMP

## 2014-12-25 ENCOUNTER — Other Ambulatory Visit: Payer: Self-pay | Admitting: *Deleted

## 2014-12-25 DIAGNOSIS — G40802 Other epilepsy, not intractable, without status epilepticus: Secondary | ICD-10-CM

## 2014-12-25 MED ORDER — DIVALPROEX SODIUM ER 500 MG PO TB24: 500.0000 mg | ORAL_TABLET | Freq: Four times a day (QID) | ORAL | Status: DC | PRN

## 2014-12-29 ENCOUNTER — Ambulatory Visit: Payer: Self-pay

## 2015-01-11 ENCOUNTER — Other Ambulatory Visit: Payer: Self-pay | Admitting: Family Medicine

## 2015-01-11 DIAGNOSIS — G40802 Other epilepsy, not intractable, without status epilepticus: Secondary | ICD-10-CM

## 2015-01-11 MED ORDER — DIVALPROEX SODIUM ER 500 MG PO TB24: 2000.0000 mg | ORAL_TABLET | Freq: Every day | ORAL | Status: DC

## 2015-02-08 ENCOUNTER — Ambulatory Visit: Payer: Self-pay

## 2015-02-11 ENCOUNTER — Emergency Department (HOSPITAL_COMMUNITY)
Admission: EM | Admit: 2015-02-11 | Discharge: 2015-02-11 | Disposition: A | Discharge status: Home / Self Care | Payer: Self-pay | Attending: Emergency Medicine | Admitting: Emergency Medicine

## 2015-02-11 ENCOUNTER — Emergency Department (HOSPITAL_COMMUNITY): Payer: Self-pay

## 2015-02-11 ENCOUNTER — Encounter (HOSPITAL_COMMUNITY): Payer: Self-pay | Admitting: Emergency Medicine

## 2015-02-11 DIAGNOSIS — J45909 Unspecified asthma, uncomplicated: Secondary | ICD-10-CM | POA: Insufficient documentation

## 2015-02-11 DIAGNOSIS — Z720 Tobacco use: Secondary | ICD-10-CM | POA: Insufficient documentation

## 2015-02-11 DIAGNOSIS — G40909 Epilepsy, unspecified, not intractable, without status epilepticus: Secondary | ICD-10-CM | POA: Insufficient documentation

## 2015-02-11 DIAGNOSIS — S93401A Sprain of unspecified ligament of right ankle, initial encounter: Secondary | ICD-10-CM | POA: Insufficient documentation

## 2015-02-11 DIAGNOSIS — Y9302 Activity, running: Secondary | ICD-10-CM | POA: Insufficient documentation

## 2015-02-11 DIAGNOSIS — X58XXXA Exposure to other specified factors, initial encounter: Secondary | ICD-10-CM | POA: Insufficient documentation

## 2015-02-11 DIAGNOSIS — Z79899 Other long term (current) drug therapy: Secondary | ICD-10-CM | POA: Insufficient documentation

## 2015-02-11 DIAGNOSIS — Y998 Other external cause status: Secondary | ICD-10-CM | POA: Insufficient documentation

## 2015-02-11 DIAGNOSIS — Y9289 Other specified places as the place of occurrence of the external cause: Secondary | ICD-10-CM | POA: Insufficient documentation

## 2015-02-11 MED ORDER — IBUPROFEN 400 MG PO TABS: 400.0000 mg | ORAL_TABLET | Freq: Three times a day (TID) | ORAL | Status: DC | PRN

## 2015-02-11 NOTE — Discharge Instructions (Signed)
Please call your doctor for a followup appointment within 24-48 hours. When you talk to your doctor please let them know that you were seen in the emergency department and have them acquire all of your records so that they can discuss the findings with you and formulate a treatment plan to fully care for your new and ongoing problems. Please follow-up with her primary care provider Please rest and stay hydrated Please avoid any physical or strenuous activity Please take medications as prescribed. Please take no more than 1200 mg of ibuprofen for this can lead to stomach bleeds. Please do not take any other anti-inflammatory while taking ibuprofen. Please rest, ice, elevate-toes above nose Please keep cam walker boot on at all times Please continue to monitor symptoms closely and if symptoms are to worsen or change (fever greater than 101, chills, sweating, nausea, vomiting, chest pain, shortness of breathe, difficulty breathing, weakness, numbness, tingling, worsening or changes to pain pattern, fall, injury, loss of sensation, changes to skin colored such as red/blue/white toes, toes feel cold to the touch, red streaks) please report back to the Emergency Department immediately.    Ankle Sprain An ankle sprain is an injury to the strong, fibrous tissues (ligaments) that hold the bones of your ankle joint together.  CAUSES An ankle sprain is usually caused by a fall or by twisting your ankle. Ankle sprains most commonly occur when you step on the outer edge of your foot, and your ankle turns inward. People who participate in sports are more prone to these types of injuries.  SYMPTOMS   Pain in your ankle. The pain may be present at rest or only when you are trying to stand or walk.  Swelling.  Bruising. Bruising may develop immediately or within 1 to 2 days after your injury.  Difficulty standing or walking, particularly when turning corners or changing directions. DIAGNOSIS  Your caregiver  will ask you details about your injury and perform a physical exam of your ankle to determine if you have an ankle sprain. During the physical exam, your caregiver will press on and apply pressure to specific areas of your foot and ankle. Your caregiver will try to move your ankle in certain ways. An X-ray exam may be done to be sure a bone was not broken or a ligament did not separate from one of the bones in your ankle (avulsion fracture).  TREATMENT  Certain types of braces can help stabilize your ankle. Your caregiver can make a recommendation for this. Your caregiver may recommend the use of medicine for pain. If your sprain is severe, your caregiver may refer you to a surgeon who helps to restore function to parts of your skeletal system (orthopedist) or a physical therapist. HOME CARE INSTRUCTIONS   Apply ice to your injury for 1-2 days or as directed by your caregiver. Applying ice helps to reduce inflammation and pain.  Put ice in a plastic bag.  Place a towel between your skin and the bag.  Leave the ice on for 15-20 minutes at a time, every 2 hours while you are awake.  Only take over-the-counter or prescription medicines for pain, discomfort, or fever as directed by your caregiver.  Elevate your injured ankle above the level of your heart as much as possible for 2-3 days.  If your caregiver recommends crutches, use them as instructed. Gradually put weight on the affected ankle. Continue to use crutches or a cane until you can walk without feeling pain in your ankle.  If you have a plaster splint, wear the splint as directed by your caregiver. Do not rest it on anything harder than a pillow for the first 24 hours. Do not put weight on it. Do not get it wet. You may take it off to take a shower or bath.  You may have been given an elastic bandage to wear around your ankle to provide support. If the elastic bandage is too tight (you have numbness or tingling in your foot or your foot  becomes cold and blue), adjust the bandage to make it comfortable.  If you have an air splint, you may blow more air into it or let air out to make it more comfortable. You may take your splint off at night and before taking a shower or bath. Wiggle your toes in the splint several times per day to decrease swelling. SEEK MEDICAL CARE IF:   You have rapidly increasing bruising or swelling.  Your toes feel extremely cold or you lose feeling in your foot.  Your pain is not relieved with medicine. SEEK IMMEDIATE MEDICAL CARE IF:  Your toes are numb or blue.  You have severe pain that is increasing. MAKE SURE YOU:   Understand these instructions.  Will watch your condition.  Will get help right away if you are not doing well or get worse. Document Released: 10/23/2005 Document Revised: 07/17/2012 Document Reviewed: 11/04/2011 [facility] Patient Information 2015 [place], [place]. This information is not intended to replace advice given to you by your health care provider. Make sure you discuss any questions you have with your health care provider.

## 2015-02-11 NOTE — ED Provider Notes (Signed)
CSN: [id number]     Arrival date & time 02/11/15  1135 History   First MD Initiated Contact with Patient 02/11/15 1156     Chief Complaint  Patient presents with  . Ankle Pain    increased pain in r/ankle after "rolling it" 2 days ago     (Consider location/radiation/quality/duration/timing/severity/associated sxs/prior Treatment) The history is provided by the patient. No language interpreter was used.  Donald NixonJairus Preece is a 29 y/o M with PMHx of epilepsy and asthma presenting to the ED with right ankle pain. Patient reported that he was running approximately 2 days ago when his right ankle rolled inverted manner. Stated he's been having a throbbing pain that is constant localize the right ankle with mild radiation to the shin and toes. Stated that yesterday the pain is increased since then, stated that the pain is worse with applying pressure. Stated that he has intermittent tingling to his toes. Patient reported that he has injured his right ankle in the past, reported more than 10 years ago with similar technique. Denied head injury, loss of consciousness, loss of sensation, numbness, surgery. PCP Dr. Armen PickupFunches  Past Medical History  Diagnosis Date  . Epilepsy 1993  . Asthma     in childhood, no asthma problems since age 89    Past Surgical History  Procedure Laterality Date  . Tonsillectomy  ?     as a childhood    Family History  Problem Relation Age of Onset  . Adopted: Yes  . Family history unknown: Yes   History  Substance Use Topics  . Smoking status: Current Some Day Smoker -- 0.50 packs/day for 5 years    Types: Cigarettes  . Smokeless tobacco: Never Used  . Alcohol Use: 0.0 oz/week    0 Standard drinks or equivalent per week     Comment: socially, evevey couple of weekes     Review of Systems  Eyes: Negative for visual disturbance.  Musculoskeletal: Positive for arthralgias (right ankle).  Neurological: Negative for weakness and numbness.      Allergies    Review of patient's allergies indicates no known allergies.  Home Medications   Prior to Admission medications   Medication Sig Start Date End Date Taking? Authorizing Provider  divalproex (DEPAKOTE ER) 500 MG 24 hr tablet Take 4 tablets (2,000 mg total) by mouth daily. 01/11/15  Yes Josalyn Funches, MD  Multiple Vitamin (MULTIVITAMIN WITH MINERALS) TABS Take 1 tablet by mouth daily.   Yes Historical Provider, MD  ibuprofen (ADVIL,MOTRIN) 400 MG tablet Take 1 tablet (400 mg total) by mouth every 8 (eight) hours as needed. 02/11/15   Taneya Conkel, PA-C   BP 123/76 mmHg  Pulse 82  Temp(Src) 98.2 F (36.8 C) (Oral)  Resp 16  SpO2 98% Physical Exam  Constitutional: He is oriented to person, place, and time. He appears well-developed and well-nourished. No distress.  HENT:  Head: Normocephalic and atraumatic.  Eyes: Conjunctivae and EOM are normal. Right eye exhibits no discharge. Left eye exhibits no discharge.  Neck: Normal range of motion. Neck supple.  Cardiovascular: Normal rate, regular rhythm and normal heart sounds.  Exam reveals no friction rub.   No murmur heard. Pulmonary/Chest: Effort normal and breath sounds normal. No respiratory distress. He has no wheezes. He has no rales.  Musculoskeletal: He exhibits edema and tenderness.       Right ankle: He exhibits decreased range of motion (Secondary to pain) and swelling. He exhibits no ecchymosis, no deformity and no laceration.  Tenderness. Lateral malleolus and AITFL tenderness found. Achilles tendon exhibits normal Thompson's test results.       Feet:  Swelling identified to the lateral aspect of the right ankle-lateral malleolus. Negative ecchymosis identified. Mild swelling identified to the right foot. Tenderness upon palpation to lateral malleolus and anterior talofibular ligament. Decreased range of motion to the right ankle secondary to pain. Patient is able to wiggle toes without difficulty. Full range of motion to the right  knee without difficulty. Negative Thompson sign.  Neurological: He is alert and oriented to person, place, and time. No cranial nerve deficit. He exhibits normal muscle tone. Coordination normal.  Strength 5+/5+ to lower extremities bilaterally with resistance applied, equal distribution noted Strength intact to digits of the left foot Sensation intact with differentiation to sharp and dull touch  Skin: Skin is warm and dry. No rash noted. He is not diaphoretic. No erythema.  Psychiatric: He has a normal mood and affect. His behavior is normal. Thought content normal.  Nursing note and vitals reviewed.   ED Course  Procedures (including critical care time) Labs Review Labs Reviewed - No data to display  Imaging Review Dg Ankle Complete Right  02/11/2015   CLINICAL DATA:  Rolled RIGHT ankle 1 day ago, lateral ankle and foot pain  EXAM: RIGHT ANKLE - COMPLETE 3+ VIEW  COMPARISON:  None  FINDINGS: Diffuse soft tissue swelling.  Osseous mineralization normal.  Ankle alignment intact.  Corticated old appearing non fused ossicle at tip of medial malleolus.  No definite acute fracture, dislocation or bone destruction.  IMPRESSION: No acute osseous abnormalities.   Electronically Signed   By: Ulyses Southward M.D.   On: 02/11/2015 14:19   Dg Foot Complete Right  02/11/2015   CLINICAL DATA:  Lateral ankle pain, rolled ankle yesterday  EXAM: RIGHT FOOT COMPLETE - 3+ VIEW  COMPARISON:  None.  FINDINGS: Three views of the right foot submitted. No acute fracture or subluxation. No radiopaque foreign body.  IMPRESSION: Negative.   Electronically Signed   By: Natasha Mead M.D.   On: 02/11/2015 14:17     EKG Interpretation None      MDM   Final diagnoses:  Ankle sprain, right, initial encounter    Medications - No data to display  Filed Vitals:   02/11/15 1143  BP: 123/76  Pulse: 82  Temp: 98.2 F (36.8 C)  TempSrc: Oral  Resp: 16  SpO2: 98%   Plain film of right foot negative for acute osseous  injury. Plain film of right ankle negative for acute osseous injury. Imaging unremarkable for acute osseous abnormalities or dislocations. Pulses palpable and strong. Negative signs of ischemia. Patient is able to wiggle toes without difficulty. Negative focal neurological deficits. Suspicion to be ankle sprain secondary to mechanism of injury. Negative Thompson sign-doubt Achilles tendon rupture. Patient placed in cam walker boot and crutches given for comfort purposes. Patient stable, afebrile. Patient not septic appearing. Discharged patient. Referred patient to PCP and orthopedics. Discussed with patient to rest, ice, elevate. Discussed with patient to avoid any physical strenuous activity. Discussed with patient to closely monitor symptoms and if symptoms are to worsen or change to report back to the ED - strict return instructions given.  Patient agreed to plan of care, understood, all questions answered.   Raymon Mutton, PA-C 02/11/15 1432  Doug Sou, MD 02/11/15 1739

## 2015-02-11 NOTE — ED Notes (Signed)
Pt reports that he "rolled " onto r/ankle while exercising 2 days ago. Pain has increased over last two days. Pt worked yesterday, used ice to ankle x 1. Did not attempt to  medicate with OTC meds. Pt stated that he is unable to put weight on foot

## 2015-02-26 ENCOUNTER — Ambulatory Visit: Payer: Self-pay | Admitting: Family Medicine

## 2015-03-14 ENCOUNTER — Encounter (HOSPITAL_COMMUNITY): Payer: Self-pay | Admitting: Emergency Medicine

## 2015-03-14 ENCOUNTER — Emergency Department (HOSPITAL_COMMUNITY)
Admission: EM | Admit: 2015-03-14 | Discharge: 2015-03-14 | Disposition: A | Discharge status: Home / Self Care | Payer: Self-pay | Attending: Emergency Medicine | Admitting: Emergency Medicine

## 2015-03-14 DIAGNOSIS — Z79899 Other long term (current) drug therapy: Secondary | ICD-10-CM | POA: Insufficient documentation

## 2015-03-14 DIAGNOSIS — J45909 Unspecified asthma, uncomplicated: Secondary | ICD-10-CM | POA: Insufficient documentation

## 2015-03-14 DIAGNOSIS — G40909 Epilepsy, unspecified, not intractable, without status epilepticus: Secondary | ICD-10-CM | POA: Insufficient documentation

## 2015-03-14 DIAGNOSIS — Z720 Tobacco use: Secondary | ICD-10-CM | POA: Insufficient documentation

## 2015-03-14 DIAGNOSIS — R000 Tachycardia, unspecified: Secondary | ICD-10-CM | POA: Insufficient documentation

## 2015-03-14 HISTORY — DX: Unspecified convulsions: R56.9

## 2015-03-14 LAB — VALPROIC ACID LEVEL: Valproic Acid Lvl: <10 ug/mL — ABNORMAL LOW (ref 50.0–100.0)

## 2015-03-14 MED ORDER — DIVALPROEX SODIUM 500 MG PO DR TAB
2000.0000 mg | DELAYED_RELEASE_TABLET | Freq: Two times a day (BID) | ORAL | Status: DC
  Administered 2015-03-14: 2000 mg via ORAL
  Filled 2015-03-14: qty 4

## 2015-03-14 NOTE — ED Notes (Signed)
[facility]G [facility]

## 2015-03-14 NOTE — ED Notes (Signed)
Bed: WA20 Expected date: 03/14/15 Expected time: 4:13 PM Means of arrival: Ambulance Comments: Seizure

## 2015-03-14 NOTE — ED Notes (Signed)
[facility] presents with a 29 yo male from work that had witnessed seizure-like activity.  [facility] and coworker were talking in breakroom when pt started stuttering then fell down assisted by coworker to floor and had full body seizure-like activity for approximately 30 seconds.  Pt was fully alert and oriented when [facility] arrived; No injury, No trauma to mouth or incontinence.  No c/o pain by patient.

## 2015-03-14 NOTE — ED Provider Notes (Signed)
CSN: [id number]     Arrival date & time 03/14/15  1610 History   First MD Initiated Contact with Patient 03/14/15 1617     No chief complaint on file.  chief complaint seizure (Consider location/radiation/quality/duration/timing/severity/associated sxs/prior Treatment) Patient is a 29 y.o. male presenting with seizures.  Seizures  Patient brought by EMS. Had grand mal seizure this afternoon while at work. He admits to missing his Depakote dosages yesterday and today. He is presently asymptomatic. EMS treated patient with saline lock. CBG in the field was 130. No other associated symptoms Past Medical History  Diagnosis Date  . Epilepsy 1993  . Asthma     in childhood, no asthma problems since age 89    Past Surgical History  Procedure Laterality Date  . Tonsillectomy  ?     as a childhood    Family History  Problem Relation Age of Onset  . Adopted: Yes  . Family history unknown: Yes   History  Substance Use Topics  . Smoking status: Current Some Day Smoker -- 0.50 packs/day for 5 years    Types: Cigarettes  . Smokeless tobacco: Never Used  . Alcohol Use: 0.0 oz/week    0 Standard drinks or equivalent per week     Comment: socially, evevey couple of weekes     occasional alcohol no illicit drug use Review of Systems  Constitutional: Negative.   HENT: Negative.   Respiratory: Negative.   Cardiovascular: Negative.   Gastrointestinal: Negative.   Musculoskeletal: Negative.   Skin: Negative.   Neurological: Positive for seizures.  Psychiatric/Behavioral: Negative.   All other systems reviewed and are negative.     Allergies  Review of patient's allergies indicates no known allergies.  Home Medications   Prior to Admission medications   Medication Sig Start Date End Date Taking? Authorizing Provider  divalproex (DEPAKOTE ER) 500 MG 24 hr tablet Take 4 tablets (2,000 mg total) by mouth daily. 01/11/15   Josalyn Funches, MD  ibuprofen (ADVIL,MOTRIN) 400 MG tablet Take  1 tablet (400 mg total) by mouth every 8 (eight) hours as needed. 02/11/15   Marissa Sciacca, PA-C  Multiple Vitamin (MULTIVITAMIN WITH MINERALS) TABS Take 1 tablet by mouth daily.    Historical Provider, MD   BP 139/79 mmHg  Pulse 122  Temp(Src) 99.1 F (37.3 C) (Oral)  Resp 20  SpO2 93% Physical Exam  Constitutional: He is oriented to person, place, and time. He appears well-developed and well-nourished.  HENT:  Head: Normocephalic and atraumatic.  No bite marks of tongue  Eyes: Conjunctivae are normal. Pupils are equal, round, and reactive to light.  Neck: Neck supple. No tracheal deviation present. No thyromegaly present.  Cardiovascular: Regular rhythm.   No murmur heard. Mildly tachycardic  Pulmonary/Chest: Effort normal and breath sounds normal.  Abdominal: Soft. Bowel sounds are normal. He exhibits no distension. There is no tenderness.  Musculoskeletal: Normal range of motion. He exhibits no edema or tenderness.  Neurological: He is alert and oriented to person, place, and time. He has normal reflexes. He displays normal reflexes. No cranial nerve deficit. He exhibits normal muscle tone. Coordination normal.  DTRs symmetric bilaterally at knee jerk ankle jerk and biceps was ordered bilaterally  Skin: Skin is warm and dry. No rash noted.  Psychiatric: He has a normal mood and affect.  Nursing note and vitals reviewed.   ED Course  Procedures (including critical care time) Labs Review Labs Reviewed - No data to display  Imaging Review No results found.  EKG Interpretation None     8 PM patient remains alert, asymptomatic, Glasgow Coma Score 15 ambulates without difficulty. Not lightheaded on standing. Results for orders placed or performed during the hospital encounter of 03/14/15  Valproic acid level  Result Value Ref Range   Valproic Acid Lvl <10 (L) 50.0 - 100.0 ug/mL   No results found.  MDM  Spoke with hospital pharmacist to suggest repeat dose Depakote  2000 mg at bedtime tonight, then resume Depakote dosage 2000 oh grams daily starting tomorrow. Patient should get his valproic acid level rechecked as week by his neurologist. He is advised not to drive until okayed by his neurologist. Diagnoses #1 seizure disorder #2 medication noncompliance Final diagnoses:  None        Doug SouSam Orel Hord, MD 03/14/15 2007

## 2015-03-14 NOTE — Discharge Instructions (Signed)
Epilepsy  take an additional dose of Depakote 2000 mg at bedtime tonight. Starting tomorrow resume your usual dose of Depakote 2000 mg daily. You must take your medicine each day or you will have more seizures. You may have more seizures until there is adequate Depakote in your bloodstream. Contact your neurologist tomorrow to get your blood Depakote level rechecked this week. No driving allowed until okayed by your neurologist. People with epilepsy have times when they shake and jerk uncontrollably (seizures). This happens when there is a sudden change in brain function. Epilepsy may have many possible causes. Anything that disturbs the normal pattern of brain cell activity can lead to seizures. HOME CARE   Follow your doctor's instructions about driving and safety during normal activities.  Get enough sleep.  Only take medicine as told by your doctor.  Avoid things that you know can cause you to have seizures (triggers).  Write down when your seizures happen and what you remember about each seizure. Write down anything you think may have caused the seizure to happen.  Tell the people you live and work with that you have seizures. Make sure they know how to help you. They should:  Cushion your head and body.  Turn you on your side.  Not restrain you.  Not place anything inside your mouth.  Call for local emergency medical help if there is any question about what has happened.  Keep all follow-up visits with your doctor. This is very important. GET HELP IF:  You get an infection or start to feel sick. You may have more seizures when you are sick.  You are having seizures more often.  Your seizure pattern is changing. GET HELP RIGHT AWAY IF:   A seizure does not stop after a few seconds or minutes.  A seizure causes you to have trouble breathing.  A seizure gives you a very bad headache.  A seizure makes you unable to speak or use a part of your body. Document Released:  08/20/2009 Document Revised: 08/13/2013 Document Reviewed: 06/04/2013 [facility] Patient Information 2015 [employer], [place]. This information is not intended to replace advice given to you by your health care provider. Make sure you discuss any questions you have with your health care provider.

## 2015-03-15 ENCOUNTER — Ambulatory Visit: Payer: Self-pay

## 2015-07-03 ENCOUNTER — Encounter (HOSPITAL_COMMUNITY): Payer: Self-pay | Admitting: Emergency Medicine

## 2015-07-03 ENCOUNTER — Emergency Department (HOSPITAL_COMMUNITY)
Admission: EM | Admit: 2015-07-03 | Discharge: 2015-07-03 | Disposition: A | Discharge status: Home / Self Care | Payer: Self-pay | Attending: Emergency Medicine | Admitting: Emergency Medicine

## 2015-07-03 DIAGNOSIS — T63463A Toxic effect of venom of wasps, assault, initial encounter: Secondary | ICD-10-CM | POA: Insufficient documentation

## 2015-07-03 DIAGNOSIS — J45909 Unspecified asthma, uncomplicated: Secondary | ICD-10-CM | POA: Insufficient documentation

## 2015-07-03 DIAGNOSIS — Y9389 Activity, other specified: Secondary | ICD-10-CM | POA: Insufficient documentation

## 2015-07-03 DIAGNOSIS — Y9289 Other specified places as the place of occurrence of the external cause: Secondary | ICD-10-CM | POA: Insufficient documentation

## 2015-07-03 DIAGNOSIS — Z7952 Long term (current) use of systemic steroids: Secondary | ICD-10-CM | POA: Insufficient documentation

## 2015-07-03 DIAGNOSIS — G40909 Epilepsy, unspecified, not intractable, without status epilepticus: Secondary | ICD-10-CM | POA: Insufficient documentation

## 2015-07-03 DIAGNOSIS — Z79899 Other long term (current) drug therapy: Secondary | ICD-10-CM | POA: Insufficient documentation

## 2015-07-03 DIAGNOSIS — Z720 Tobacco use: Secondary | ICD-10-CM | POA: Insufficient documentation

## 2015-07-03 DIAGNOSIS — Y998 Other external cause status: Secondary | ICD-10-CM | POA: Insufficient documentation

## 2015-07-03 MED ORDER — CEPHALEXIN 500 MG PO CAPS: 500.0000 mg | ORAL_CAPSULE | Freq: Three times a day (TID) | ORAL | Status: DC

## 2015-07-03 MED ORDER — HYDROCORTISONE VALERATE 0.2 % EX CREA: TOPICAL_CREAM | Freq: Two times a day (BID) | CUTANEOUS | Status: DC

## 2015-07-03 MED ORDER — CEPHALEXIN 500 MG PO CAPS
500.0000 mg | ORAL_CAPSULE | Freq: Once | ORAL | Status: AC
  Administered 2015-07-03: 500 mg via ORAL
  Filled 2015-07-03: qty 1

## 2015-07-03 MED ORDER — PREDNISONE 20 MG PO TABS
60.0000 mg | ORAL_TABLET | Freq: Once | ORAL | Status: AC
  Administered 2015-07-03: 60 mg via ORAL
  Filled 2015-07-03: qty 3

## 2015-07-03 MED ORDER — PREDNISONE 20 MG PO TABS: 60.0000 mg | ORAL_TABLET | Freq: Every day | ORAL | Status: DC

## 2015-07-03 MED ORDER — HYDROCORTISONE VALERATE 0.2 % EX CREA
TOPICAL_CREAM | Freq: Two times a day (BID) | CUTANEOUS | Status: DC
  Administered 2015-07-03: 1 via TOPICAL
  Filled 2015-07-03: qty 15
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
## 2015-07-03 NOTE — Discharge Instructions (Signed)
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
## 2015-07-30 ENCOUNTER — Emergency Department (HOSPITAL_COMMUNITY)
Admission: EM | Admit: 2015-07-30 | Discharge: 2015-07-30 | Disposition: A | Discharge status: Home / Self Care | Payer: Self-pay | Attending: Emergency Medicine | Admitting: Emergency Medicine

## 2015-07-30 ENCOUNTER — Encounter (HOSPITAL_COMMUNITY): Payer: Self-pay | Admitting: Emergency Medicine

## 2015-07-30 DIAGNOSIS — J45909 Unspecified asthma, uncomplicated: Secondary | ICD-10-CM | POA: Insufficient documentation

## 2015-07-30 DIAGNOSIS — Z79899 Other long term (current) drug therapy: Secondary | ICD-10-CM | POA: Insufficient documentation

## 2015-07-30 DIAGNOSIS — Z720 Tobacco use: Secondary | ICD-10-CM | POA: Insufficient documentation

## 2015-07-30 DIAGNOSIS — N481 Balanitis: Secondary | ICD-10-CM | POA: Insufficient documentation

## 2015-07-30 DIAGNOSIS — Z7952 Long term (current) use of systemic steroids: Secondary | ICD-10-CM | POA: Insufficient documentation

## 2015-07-30 DIAGNOSIS — G40909 Epilepsy, unspecified, not intractable, without status epilepticus: Secondary | ICD-10-CM | POA: Insufficient documentation

## 2015-07-30 DIAGNOSIS — Z792 Long term (current) use of antibiotics: Secondary | ICD-10-CM | POA: Insufficient documentation

## 2015-07-30 LAB — CBG MONITORING, ED: Glucose-Capillary: 100 mg/dL — ABNORMAL HIGH (ref 65–99)

## 2015-07-30 LAB — URINALYSIS, ROUTINE W REFLEX MICROSCOPIC
Bilirubin Urine: NEGATIVE
Glucose, UA: NEGATIVE mg/dL
HGB URINE DIPSTICK: NEGATIVE
Ketones, ur: 15 mg/dL — AB
Leukocytes, UA: NEGATIVE
Nitrite: NEGATIVE
Protein, ur: 30 mg/dL — AB
SPECIFIC GRAVITY, URINE: 1.033 — AB (ref 1.005–1.030)
Urobilinogen, UA: 1 mg/dL (ref 0.0–1.0)
pH: 6 (ref 5.0–8.0)

## 2015-07-30 LAB — URINE MICROSCOPIC-ADD ON

## 2015-07-30 MED ORDER — CLOTRIMAZOLE 1 % EX CREA: TOPICAL_CREAM | CUTANEOUS | Status: DC

## 2015-07-30 NOTE — ED Provider Notes (Signed)
CSN: [id number]     Arrival date & time 07/30/15  1841 History   First MD Initiated Contact with Patient 07/30/15 1851     Chief Complaint  Patient presents with  . Groin Pain  . Pelvic Pain  . Abdominal Pain  . Headache  . Fever  . Groin Swelling     (Consider location/radiation/quality/duration/timing/severity/associated sxs/prior Treatment) HPI Comments: Patient sexually active, in a monogamous relationship. Notice swelling around foreskin of penis today.  Headache yesterday with fever, now resolved.   Patient is a 29 y.o. male presenting with male genitourinary complaint. The history is provided by the patient. No language interpreter was used.  Male GU Problem Presenting symptoms: no penile discharge   Presenting symptoms comment:  Foreskin swelling Associated symptoms: groin pain, nausea and penile swelling   Associated symptoms: no flank pain, no genital itching, no genital lesions, no scrotal swelling and no urinary frequency   Risk factors: recent sexual activity   Risk factors: does not have multiple sexual partners and no new sexual partner     Past Medical History  Diagnosis Date  . Epilepsy 1993  . Asthma     in childhood, no asthma problems since age 74   . Seizure    Past Surgical History  Procedure Laterality Date  . Tonsillectomy  ?     as a childhood    Family History  Problem Relation Age of Onset  . Adopted: Yes  . Family history unknown: Yes   Social History  Substance Use Topics  . Smoking status: Current Some Day Smoker -- 0.50 packs/day for 5 years    Types: Cigarettes  . Smokeless tobacco: Never Used  . Alcohol Use: 0.0 oz/week    0 Standard drinks or equivalent per week     Comment: socially, evevey couple of weekes     Review of Systems  Gastrointestinal: Positive for nausea.  Genitourinary: Positive for penile swelling. Negative for frequency, flank pain, discharge, scrotal swelling and genital sores.  Neurological: Positive for  headaches.       Light sensitivity  All other systems reviewed and are negative.     Allergies  Review of patient's allergies indicates no known allergies.  Home Medications   Prior to Admission medications   Medication Sig Start Date End Date Taking? Authorizing Provider  cephALEXin (KEFLEX) 500 MG capsule Take 1 capsule (500 mg total) by mouth 3 (three) times daily. 07/03/15   Marisa Severin, MD  diphenhydrAMINE (BENADRYL) 12.5 MG/5ML liquid Take 12.5 mg by mouth 4 (four) times daily as needed for itching or allergies.    Historical Provider, MD  divalproex (DEPAKOTE ER) 500 MG 24 hr tablet Take 4 tablets (2,000 mg total) by mouth daily. 01/11/15   Josalyn Funches, MD  hydrocortisone valerate cream (WESTCORT) 0.2 % Apply topically 2 (two) times daily. 07/03/15   Marisa Severin, MD  ibuprofen (ADVIL,MOTRIN) 400 MG tablet Take 1 tablet (400 mg total) by mouth every 8 (eight) hours as needed. Patient not taking: Reported on 07/03/2015 02/11/15   Marissa Sciacca, PA-C  Multiple Vitamin (MULTIVITAMIN WITH MINERALS) TABS tablet Take 1 tablet by mouth daily.    Historical Provider, MD  predniSONE (DELTASONE) 20 MG tablet Take 3 tablets (60 mg total) by mouth daily. 07/03/15   Marisa Severin, MD   BP 138/87 mmHg  Pulse 104  Temp(Src) 98.2 F (36.8 C) (Oral)  Resp 16  Ht  (1.753 m)  Wt 246 lb (111.585 kg)  BMI 36.31 kg/m2  SpO2 100% Physical Exam  Constitutional: He is oriented to person, place, and time. He appears well-developed and well-nourished.  HENT:  Head: Normocephalic.  Mouth/Throat: No oropharyngeal exudate.  Eyes: Conjunctivae and EOM are normal. Pupils are equal, round, and reactive to light.  Neck: Neck supple.  Cardiovascular: Normal rate and regular rhythm.   Pulmonary/Chest: Effort normal and breath sounds normal.  Abdominal: Soft. Hernia confirmed negative in the right inguinal area and confirmed negative in the left inguinal area.  Genitourinary: Testes normal.     Cremasteric reflex is present. Right testis shows no swelling and no tenderness. Left testis shows no swelling and no tenderness. Uncircumcised. No phimosis or paraphimosis.  Musculoskeletal: Normal range of motion. He exhibits no edema or tenderness.  Lymphadenopathy:       Right: No inguinal adenopathy present.       Left: No inguinal adenopathy present.  Neurological: He is alert and oriented to person, place, and time.  Skin: Skin is warm and dry.  Psychiatric: He has a normal mood and affect.  Nursing note and vitals reviewed.   ED Course  Procedures (including critical care time) Labs Review Labs Reviewed  URINALYSIS, ROUTINE W REFLEX MICROSCOPIC (NOT AT [facility]) - Abnormal; Notable for the following:    Specific Gravity, Urine 1.033 (*)    Ketones, ur 15 (*)    Protein, ur 30 (*)    All other components within normal limits  CBG MONITORING, ED - Abnormal; Notable for the following:    Glucose-Capillary 100 (*)    All other components within normal limits  URINE MICROSCOPIC-ADD ON    Imaging Review No results found. I have personally reviewed and evaluated these images and lab results as part of my medical decision-making.   EKG Interpretation None      MDM   Final diagnoses:  None    Balanitis. RX for clotrimazole. Follow-up with PCP. Return precautions discussed.     Felicie Morn, NP 07/30/15 2200  Felicie Morn, NP 07/30/15 9604  Azalia Bilis, MD 07/30/15 [phone]

## 2015-07-30 NOTE — ED Notes (Signed)
Pt reports symptoms starting yesterday-headache/groin pain/lower abdominal pain/pelvic pain. Says when he woke up this morning his "genitalia was swollen." Denies new sexual partners. Denies penile discharge and denies dysuria. Tmax 102 yesterday. Took advil with alleviation of fever. Denies N/V but had "bad diarrhea yesterday." No other c/c.

## 2015-07-30 NOTE — Discharge Instructions (Signed)
Balanitis  Balanitis is inflammation of the head of the penis (glans).   CAUSES   Balanitis has multiple causes, both infectious and noninfectious. Often balanitis is the result of poor personal hygiene, especially in uncircumcised males. Without adequate washing, viruses, bacteria, and yeast collect between the foreskin and the glans. This can cause an infection. Lack of air and irritation from a normal secretion called smegma contribute to the cause in uncircumcised males. Other causes include:   Chemical irritation from the use of certain soaps and shower gels (especially soaps with perfumes), condoms, personal lubricants, petroleum jelly, spermicides, and fabric conditioners.   Skin conditions, such as eczema, dermatitis, and psoriasis.   Allergies to drugs, such as tetracycline and sulfa.   Certain medical conditions, including liver cirrhosis, congestive heart failure, and kidney disease.   Morbid obesity.  RISK FACTORS   Diabetes mellitus.   A tight foreskin that is difficult to pull back past the glans (phimosis).   Sex without the use of a condom.  SIGNS AND SYMPTOMS   Symptoms may include:   Discharge coming from under the foreskin.   Tenderness.   Itching and inability to get an erection (because of the pain).   Redness and a rash.   Sores on the glans and on the foreskin.  DIAGNOSIS  Diagnosis of balanitis is confirmed through a physical exam.  TREATMENT  The treatment is based on the cause of the balanitis. Treatment may include:   Frequent cleansing.   Keeping the glans and foreskin dry.   Use of medicines such as creams, pain medicines, antibiotics, or medicines to treat fungal infections.   Sitz baths.  If the irritation has caused a scar on the foreskin that prevents easy retraction, a circumcision may be recommended.   HOME CARE INSTRUCTIONS   Sex should be avoided until the condition has cleared.  MAKE SURE YOU:   Understand these instructions.   Will watch your  condition.   Will get help right away if you are not doing well or get worse.  Document Released: 03/11/2009 Document Revised: 10/28/2013 Document Reviewed: 04/14/2013  Exit[employer] Patient Information 2015 [employer]. This information is not intended to replace advice given to you by your health care provider. Make sure you discuss any questions you have with your health care provider.

## 2015-07-30 NOTE — ED Notes (Signed)
Pt alert,oriented, and ambulatory upon DC. He was advised to follow up with PCP if he worsens. He was advised about proper cleaning and was encouraged to use fungal cream.

## 2015-12-03 MED FILL — $DEPAKOTE ER 500 MG TAB SA: 500 | 30 days supply | Qty: 120 | Fill #3

## 2016-01-10 MED FILL — $DEPAKOTE ER 500 MG TAB SA: 500 | 30 days supply | Qty: 120 | Fill #4

## 2016-01-26 ENCOUNTER — Emergency Department (HOSPITAL_COMMUNITY)
Admission: EM | Admit: 2016-01-26 | Discharge: 2016-01-26 | Disposition: A | Discharge status: Home / Self Care | Payer: Self-pay | Attending: Emergency Medicine | Admitting: Emergency Medicine

## 2016-01-26 ENCOUNTER — Encounter (HOSPITAL_COMMUNITY): Payer: Self-pay | Admitting: *Deleted

## 2016-01-26 DIAGNOSIS — F17210 Nicotine dependence, cigarettes, uncomplicated: Secondary | ICD-10-CM | POA: Insufficient documentation

## 2016-01-26 DIAGNOSIS — R569 Unspecified convulsions: Secondary | ICD-10-CM

## 2016-01-26 DIAGNOSIS — J45909 Unspecified asthma, uncomplicated: Secondary | ICD-10-CM | POA: Insufficient documentation

## 2016-01-26 DIAGNOSIS — Z79899 Other long term (current) drug therapy: Secondary | ICD-10-CM | POA: Insufficient documentation

## 2016-01-26 DIAGNOSIS — G40909 Epilepsy, unspecified, not intractable, without status epilepticus: Secondary | ICD-10-CM | POA: Insufficient documentation

## 2016-01-26 LAB — CBC WITH DIFFERENTIAL/PLATELET
Basophils Absolute: 0 10*3/uL (ref 0.0–0.1)
Basophils Relative: 0 %
EOS ABS: 0.1 10*3/uL (ref 0.0–0.7)
Eosinophils Relative: 1 %
HCT: 40.8 % (ref 39.0–52.0)
HEMOGLOBIN: 14.1 g/dL (ref 13.0–17.0)
LYMPHS ABS: 2.9 10*3/uL (ref 0.7–4.0)
Lymphocytes Relative: 54 %
MCH: 30 pg (ref 26.0–34.0)
MCHC: 34.6 g/dL (ref 30.0–36.0)
MCV: 86.8 fL (ref 78.0–100.0)
Monocytes Absolute: 0.7 10*3/uL (ref 0.1–1.0)
Monocytes Relative: 12 %
NEUTROS PCT: 33 %
Neutro Abs: 1.8 10*3/uL (ref 1.7–7.7)
PLATELETS: 206 10*3/uL (ref 150–400)
RBC: 4.7 MIL/uL (ref 4.22–5.81)
RDW: 14.4 % (ref 11.5–15.5)
WBC: 5.4 10*3/uL (ref 4.0–10.5)

## 2016-01-26 LAB — BASIC METABOLIC PANEL WITH GFR
Anion gap: 11 (ref 5–15)
BUN: 12 mg/dL (ref 6–20)
CO2: 24 mmol/L (ref 22–32)
Calcium: 9.4 mg/dL (ref 8.9–10.3)
Chloride: 108 mmol/L (ref 101–111)
Creatinine, Ser: 0.87 mg/dL (ref 0.61–1.24)
GFR calc Af Amer: >60 mL/min
GFR calc non Af Amer: >60 mL/min
Glucose, Bld: 103 mg/dL — ABNORMAL HIGH (ref 65–99)
Potassium: 3.8 mmol/L (ref 3.5–5.1)
Sodium: 143 mmol/L (ref 135–145)

## 2016-01-26 LAB — VALPROIC ACID LEVEL: VALPROIC ACID LVL: 13 ug/mL — AB (ref 50.0–100.0)

## 2016-01-26 NOTE — Discharge Instructions (Signed)
Take an extra 2000mg  of your depakote tonight, then resume normal dosing tomorrow. Recommend to follow-up with your neurologist if continue having breakthrough seizures. Follow-up with PCP also recommended. Return here for new concerns.

## 2016-01-26 NOTE — ED Notes (Signed)
Per EMS, pt from home, family member reports witnessed seizure episode that lasted around 4 minutes.  No incontinence noted.  Brief post-ictal period.  Last episode was last year.  Takes depakote.  Pt is A&O x 4 at present.

## 2016-01-26 NOTE — ED Notes (Signed)
Coke given

## 2016-01-26 NOTE — ED Provider Notes (Signed)
CSN: [id number]     Arrival date & time 01/26/16  1514 History   First MD Initiated Contact with Patient 01/26/16 1536     Chief Complaint  Patient presents with  . Seizures     (Consider location/radiation/quality/duration/timing/severity/associated sxs/prior Treatment) Patient is a 30 y.o. male presenting with seizures. The history is provided by the patient and medical records.  Seizures   30 year old male with history of epilepsy and asthma, presenting to the ED after a witnessed seizure. Patient states for the past month he has been working third shift and has been getting off work at 0600 in the morning. He states sometimes he only gets 3-4 hours of sleep at night, especially if he has errands to run.  He states today when he got off work he felt funny and was having some tremors which is usually a warning sign of impending seizure.  He states he took his depakote as directed and slept for a few hours but had to go grocery shopping.  He states he went to [employer] and began to feel weak while in the store and had a seizure. This was witnessed by other shoppers, it was reported to EMS that this lasted for approximately 4 minutes. No bowel or bladder incontinence noted. Patient was initially postictal, but became awake and alert in route to the ED with EMS. Patient reports he feels back to baseline at this time. His last seizure was approximately year ago. He states he has not missed any doses of his Depakote. He is followed by neurology, Dr. Hyacinth Meeker.   Past Medical History  Diagnosis Date  . Epilepsy (HCC) 1993  . Asthma     in childhood, no asthma problems since age 58   . Seizure [facility])    Past Surgical History  Procedure Laterality Date  . Tonsillectomy  ?     as a childhood    Family History  Problem Relation Age of Onset  . Adopted: Yes  . Family history unknown: Yes   Social History  Substance Use Topics  . Smoking status: Current Some Day Smoker -- 0.50 packs/day for 5  years    Types: Cigarettes  . Smokeless tobacco: Never Used  . Alcohol Use: 0.0 oz/week    0 Standard drinks or equivalent per week     Comment: socially, evevey couple of weekes     Review of Systems  Neurological: Positive for seizures.  All other systems reviewed and are negative.     Allergies  Review of patient's allergies indicates no known allergies.  Home Medications   Prior to Admission medications   Medication Sig Start Date End Date Taking? Authorizing Provider  cephALEXin (KEFLEX) 500 MG capsule Take 1 capsule (500 mg total) by mouth 3 (three) times daily. Patient not taking: Reported on 07/30/2015 07/03/15   Marisa Severin, MD  clotrimazole (LOTRIMIN) 1 % cream Apply to affected area 2 times daily 07/30/15   Felicie Morn, NP  divalproex (DEPAKOTE ER) 500 MG 24 hr tablet Take 4 tablets (2,000 mg total) by mouth daily. 01/11/15   Josalyn Funches, MD  hydrocortisone valerate cream (WESTCORT) 0.2 % Apply topically 2 (two) times daily. Patient not taking: Reported on 07/30/2015 07/03/15   Marisa Severin, MD  ibuprofen (ADVIL,MOTRIN) 200 MG tablet Take 400 mg by mouth every 6 (six) hours as needed for headache, mild pain or moderate pain.    Historical Provider, MD  ibuprofen (ADVIL,MOTRIN) 400 MG tablet Take 1 tablet (400 mg total) by  mouth every 8 (eight) hours as needed. Patient not taking: Reported on 07/03/2015 02/11/15   Marissa Sciacca, PA-C  Multiple Vitamin (MULTIVITAMIN WITH MINERALS) TABS tablet Take 1 tablet by mouth daily.    Historical Provider, MD  predniSONE (DELTASONE) 20 MG tablet Take 3 tablets (60 mg total) by mouth daily. Patient not taking: Reported on 07/30/2015 07/03/15   Marisa Severinlga Otter, MD   BP 136/90 mmHg  Pulse 89  Temp(Src) 97.9 F (36.6 C) (Oral)  Resp 18  SpO2 95%   Physical Exam  Constitutional: He is oriented to person, place, and time. He appears well-developed and well-nourished.  HENT:  Head: Normocephalic and atraumatic.  Mouth/Throat: Oropharynx is  clear and moist.  No visible signs of head trauma; midface stable; no tongue laceration or abrasion  Eyes: Conjunctivae and EOM are normal. Pupils are equal, round, and reactive to light.  Neck: Normal range of motion.  Cardiovascular: Normal rate, regular rhythm and normal heart sounds.   Pulmonary/Chest: Effort normal and breath sounds normal.  Abdominal: Soft. Bowel sounds are normal.  Musculoskeletal: Normal range of motion.  Neurological: He is alert and oriented to person, place, and time.  AAOx3, answering questions and following commands appropriately; equal strength UE and LE bilaterally; CN grossly intact; moves all extremities appropriately without ataxia; no focal neuro deficits or facial asymmetry appreciated  Skin: Skin is warm and dry.  Psychiatric: He has a normal mood and affect.  Nursing note and vitals reviewed.   ED Course  Procedures (including critical care time) Labs Review Labs Reviewed  VALPROIC ACID LEVEL - Abnormal; Notable for the following:    Valproic Acid Lvl 13 (*)    All other components within normal limits  BASIC METABOLIC PANEL - Abnormal; Notable for the following:    Glucose, Bld 103 (*)    All other components within normal limits  CBC WITH DIFFERENTIAL/PLATELET  CBG MONITORING, ED    Imaging Review No results found. I have personally reviewed and evaluated these images and lab results as part of my medical decision-making.   EKG Interpretation None      MDM   Final diagnoses:  Seizure [facility](HCC)   30 y.o. M here with seizure, history of some.  Patient initially post-ictal with EMS but awake, alert, and fully oriented in ED without any complaints.  Reports he has been sleep deprived lately but has been compliant with meds.  Patient's depakote level low at 13, other labs reassuring.  Spoke with pharmacist regarding oral load-- recommends give additional 2000mg  ER dose tonight, resume regular dosing tomorrow.   Patient would prefer to take  extra dose from meds he has at home as he is anxious to leave ED.  He acknowledges understanding and purpose of loading dose.  Will follow-up with his neurologist and PCP.  Discussed plan with patient, he/she acknowledged understanding and agreed with plan of care.  Return precautions given for new or worsening symptoms.  Garlon HatchetLisa M Sanders, PA-C 01/26/16 1732  Melene Planan Floyd, DO 01/26/16 1737

## 2016-02-10 ENCOUNTER — Other Ambulatory Visit: Payer: Self-pay | Admitting: Family Medicine

## 2016-02-10 DIAGNOSIS — G40802 Other epilepsy, not intractable, without status epilepticus: Secondary | ICD-10-CM

## 2016-02-11 MED FILL — $DEPAKOTE ER 500 MG TAB SA: 500 | 10 days supply | Qty: 40 | Fill #0

## 2016-02-22 ENCOUNTER — Other Ambulatory Visit: Payer: Self-pay | Admitting: Family Medicine

## 2016-02-22 MED FILL — !DEPAKOTE 500 MG ER TABLET: 500 | 30 days supply | Qty: 120 | Fill #0

## 2016-03-24 MED FILL — !DEPAKOTE 500 MG ER TABLET: 500 | 15 days supply | Qty: 60 | Fill #1

## 2016-04-11 ENCOUNTER — Other Ambulatory Visit: Payer: Self-pay | Admitting: *Deleted

## 2016-04-11 DIAGNOSIS — G40802 Other epilepsy, not intractable, without status epilepticus: Secondary | ICD-10-CM

## 2016-04-12 MED FILL — DIVALPROEX SOD ER 500 MG TA: 500 | 15 days supply | Qty: 60 | Fill #2

## 2016-04-27 ENCOUNTER — Encounter: Payer: Self-pay | Admitting: Family Medicine

## 2016-04-27 ENCOUNTER — Ambulatory Visit: Payer: Self-pay | Attending: Family Medicine | Admitting: Family Medicine

## 2016-04-27 VITALS — BP 113/75 | HR 76 | Temp 97.7°F | Resp 16 | Ht 69.0 in | Wt 228.0 lb

## 2016-04-27 DIAGNOSIS — Z79899 Other long term (current) drug therapy: Secondary | ICD-10-CM | POA: Insufficient documentation

## 2016-04-27 DIAGNOSIS — F17200 Nicotine dependence, unspecified, uncomplicated: Secondary | ICD-10-CM

## 2016-04-27 DIAGNOSIS — G40802 Other epilepsy, not intractable, without status epilepticus: Secondary | ICD-10-CM

## 2016-04-27 DIAGNOSIS — F17210 Nicotine dependence, cigarettes, uncomplicated: Secondary | ICD-10-CM | POA: Insufficient documentation

## 2016-04-27 DIAGNOSIS — G8929 Other chronic pain: Secondary | ICD-10-CM | POA: Insufficient documentation

## 2016-04-27 DIAGNOSIS — M25561 Pain in right knee: Secondary | ICD-10-CM | POA: Insufficient documentation

## 2016-04-27 DIAGNOSIS — R569 Unspecified convulsions: Secondary | ICD-10-CM | POA: Insufficient documentation

## 2016-04-27 DIAGNOSIS — Z791 Long term (current) use of non-steroidal anti-inflammatories (NSAID): Secondary | ICD-10-CM | POA: Insufficient documentation

## 2016-04-27 DIAGNOSIS — Z720 Tobacco use: Secondary | ICD-10-CM

## 2016-04-27 MED FILL — ?DIVALPROEX SOD ER 500 MG T: 500 MG | 30 days supply | Qty: 120 | Fill #3

## 2016-04-27 NOTE — Progress Notes (Deleted)
   Subjective:    Patient ID: Donald Travis, male    DOB: 1986/01/01, 30 y.o.   MRN: [id number] CC: epilepsy  HPI 30 yo M NP:  1. Epilepsy: since age 89. Grand mal type. Last seizure in 2012.  [place] [place] [facility] [place]. Has trouble affording depakote at times.   2. Smoker: 1/2 PPD x 5 years. Ready to quit. Last cigarette 1 week ago. Planning to quit w/o medication assistance. Feels confident in his ability to quit.   Soc Hx: current smoker, 1/2 PPD Med Hx: seizures dx at age 89 Fam Hx: unknown adopted  Review of Systems As per HPI     Objective:   Physical Exam There were no vitals taken for this visit. General appearance: alert, cooperative and no distress Lungs: clear to auscultation bilaterally Heart: regular rate and rhythm, S1, S2 normal, no murmur, click, rub or gallop Extremities: extremities normal, atraumatic, no cyanosis or edema    Assessment & Plan:

## 2016-04-27 NOTE — Progress Notes (Signed)
Subjective:  Patient ID: Donald Travis, male    DOB: 08/28/1986  Age: 30 y.o. MRN: [id number]  CC: Follow-up   HPI Donald Travis presents for  1. F/u seizures: seizures since age 89.  Still seizure free since 2012. He takes Depakote daily. Has trouble falling asleep at times. He does work third shift.   2. R knee pain: since 2010. 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 Has pain in knee since 2010.  Pain is 3/10. Worse when it rains or when it is cold outside. Sometimes when walking upstairs it goes out. Intermittent swelling. No redness. Ibuprofen helps with the pain. He takes a dose 1-2 times per week. He has previous knee x-ray was advised to have knee MRI to evaluate soft tissue but did not due to lack of insurance.   3. Current smoker: desires to quit. Has cut down from 1/ 2 to 1/4 PPD. No cough, CP or SOB.   Social History  Substance Use Topics  . Smoking status: Current Some Day Smoker -- 0.25 packs/day for 5 years    Types: Cigarettes  . Smokeless tobacco: Never Used  . Alcohol Use: 0.0 oz/week    0 Standard drinks or equivalent per week     Comment: socially, evevey couple of weekes     Outpatient Prescriptions Prior to Visit  Medication Sig Dispense Refill  . DEPAKOTE ER 500 MG 24 hr tablet TAKE 4 TABLETS BY MOUTH DAILY 360 tablet 3  . ibuprofen (ADVIL,MOTRIN) 200 MG tablet Take 400 mg by mouth every 6 (six) hours as needed for headache, mild pain or moderate pain.    Marland Kitchen. ibuprofen (ADVIL,MOTRIN) 400 MG tablet Take 1 tablet (400 mg total) by mouth every 8 (eight) hours as needed. 11 tablet 0  . Multiple Vitamin (MULTIVITAMIN WITH MINERALS) TABS tablet Take 1 tablet by mouth daily.    . cephALEXin (KEFLEX) 500 MG capsule Take 1 capsule (500 mg total) by mouth 3 (three) times daily. (Patient not taking: Reported on 07/30/2015) 30 capsule 0  . clotrimazole (LOTRIMIN) 1 % cream Apply to affected area 2 times daily (Patient not taking: Reported on 01/26/2016) 15 g 0    . hydrocortisone valerate cream (WESTCORT) 0.2 % Apply topically 2 (two) times daily. (Patient not taking: Reported on 07/30/2015) 45 g 0  . predniSONE (DELTASONE) 20 MG tablet Take 3 tablets (60 mg total) by mouth daily. (Patient not taking: Reported on 07/30/2015) 15 tablet 0   No facility-administered medications prior to visit.    ROS Review of Systems  Constitutional: Negative for fever, chills, fatigue and unexpected weight change.  Eyes: Negative for visual disturbance.  Respiratory: Negative for cough and shortness of breath.   Cardiovascular: Negative for chest pain, palpitations and leg swelling.  Gastrointestinal: Negative for nausea, vomiting, abdominal pain, diarrhea, constipation and blood in stool.  Endocrine: Negative for polydipsia, polyphagia and polyuria.  Musculoskeletal: Positive for arthralgias. Negative for myalgias, back pain, gait problem and neck pain.  Skin: Negative for rash.  Allergic/Immunologic: Negative for immunocompromised state.  Hematological: Negative for adenopathy. Does not bruise/bleed easily.  Psychiatric/Behavioral: Positive for suicidal ideas. Negative for sleep disturbance and dysphoric mood. The patient is not nervous/anxious.     Objective:  BP 113/75 mmHg  Pulse 76  Temp(Src) 97.7 F (36.5 C) (Oral)  Resp 16  Ht 5\' 9"  (1.753 m)  Wt 228 lb (103.42 kg)  BMI 33.65 kg/m2  SpO2 99%  BP/Weight 04/27/2016 01/26/2016 07/30/2015  Systolic BP  113 137 137  Diastolic BP 75 81 77  Wt. (Lbs) 228 250 246  BMI 33.65 35.87 36.31   Physical Exam  Constitutional: He appears well-developed and well-nourished. No distress.  HENT:  Head: Normocephalic and atraumatic.  Neck: Normal range of motion. Neck supple.  Cardiovascular: Normal rate, regular rhythm, normal heart sounds and intact distal pulses.   Pulmonary/Chest: Effort normal and breath sounds normal.  Musculoskeletal: He exhibits no edema.       Right knee: Normal.  Neurological: He is  alert.  Skin: Skin is warm and dry. No rash noted. No erythema.  Psychiatric: He has a normal mood and affect.     Assessment & Plan:   There are no diagnoses linked to this encounter. Kayce was seen today for follow-up.  Diagnoses and all orders for this visit:  Other epilepsy without status epilepticus, not intractable (HCC) -     Split night study; Future  Current smoker  Chronic patellofemoral pain of right knee -     Ambulatory referral to Sports Medicine   No orders of the defined types were placed in this encounter.    Follow-up: Return in about 6 months (around 10/27/2016) for f/u seizures, knee pain .   Dessa PhiJosalyn Shawn Dannenberg MD

## 2016-04-27 NOTE — Patient Instructions (Addendum)
Donald Travis was seen today for follow-up.  Diagnoses and all orders for this visit:  Other epilepsy without status epilepticus, not intractable (HCC) -     Split night study; Future  Current smoker  Chronic patellofemoral pain of right knee -     Ambulatory referral to Sports Medicine   Smoking cessation support: smoking cessation hotline: 1-800-QUIT-NOW.  Smoking cessation classes are available through [facility]. Call [phone] or visit our website at [email or web address].  Please apply for Rossie discount and orange card, you can also inquire if any of your medications are on the PASS (medications assistance) list.   F/u in 6 weeks for smoking cessation with clinical pharmacologist  F/u with me in 6 months, sooner if needed   Dr. Armen PickupFunches  Generic Knee Exercises EXERCISES RANGE OF MOTION (ROM) AND STRETCHING EXERCISES These exercises may help you when beginning to rehabilitate your injury. Your symptoms may resolve with or without further involvement from your physician, physical therapist, or athletic trainer. While completing these exercises, remember:   Restoring tissue flexibility helps normal motion to return to the joints. This allows healthier, less painful movement and activity.  An effective stretch should be held for at least 30 seconds.  A stretch should never be painful. You should only feel a gentle lengthening or release in the stretched tissue. STRETCH - Knee Extension, Prone  Lie on your stomach on a firm surface, such as a bed or countertop. Place your right / left knee and leg just beyond the edge of the surface. You may wish to place a towel under the far end of your right / left thigh for comfort.  Relax your leg muscles and allow gravity to straighten your knee. Your clinician may advise you to add an ankle weight if more resistance is helpful for you.  You should feel a stretch in the back of your right / left knee. Hold this  position for __________ seconds. Repeat __________ times. Complete this stretch __________ times per day. * Your physician, physical therapist, or athletic trainer may ask you to add ankle weight to enhance your stretch.  RANGE OF MOTION - Knee Flexion, Active  Lie on your back with both knees straight. (If this causes back discomfort, bend your opposite knee, placing your foot flat on the floor.)  Slowly slide your heel back toward your buttocks until you feel a gentle stretch in the front of your knee or thigh.  Hold for __________ seconds. Slowly slide your heel back to the starting position. Repeat __________ times. Complete this exercise __________ times per day.  STRETCH - Quadriceps, Prone   Lie on your stomach on a firm surface, such as a bed or padded floor.  Bend your right / left knee and grasp your ankle. If you are unable to reach your ankle or pant leg, use a belt around your foot to lengthen your reach.  Gently pull your heel toward your buttocks. Your knee should not slide out to the side. You should feel a stretch in the front of your thigh and/or knee.  Hold this position for __________ seconds. Repeat __________ times. Complete this stretch __________ times per day.  STRETCH - Hamstrings, Supine   Lie on your back. Loop a belt or towel over the ball of your right / left foot.  Straighten your right / left knee and slowly pull on the belt to raise your leg. Do not allow the right / left knee to bend. Keep  your opposite leg flat on the floor.  Raise the leg until you feel a gentle stretch behind your right / left knee or thigh. Hold this position for __________ seconds. Repeat __________ times. Complete this stretch __________ times per day.  STRENGTHENING EXERCISES These exercises may help you when beginning to rehabilitate your injury. They may resolve your symptoms with or without further involvement from your physician, physical therapist, or athletic trainer. While  completing these exercises, remember:   Muscles can gain both the endurance and the strength needed for everyday activities through controlled exercises.  Complete these exercises as instructed by your physician, physical therapist, or athletic trainer. Progress the resistance and repetitions only as guided.  You may experience muscle soreness or fatigue, but the pain or discomfort you are trying to eliminate should never worsen during these exercises. If this pain does worsen, stop and make certain you are following the directions exactly. If the pain is still present after adjustments, discontinue the exercise until you can discuss the trouble with your clinician. STRENGTH - Quadriceps, Isometrics  Lie on your back with your right / left leg extended and your opposite knee bent.  Gradually tense the muscles in the front of your right / left thigh. You should see either your knee cap slide up toward your hip or increased dimpling just above the knee. This motion will push the back of the knee down toward the floor/mat/bed on which you are lying.  Hold the muscle as tight as you can without increasing your pain for __________ seconds.  Relax the muscles slowly and completely in between each repetition. Repeat __________ times. Complete this exercise __________ times per day.  STRENGTH - Quadriceps, Short Arcs   Lie on your back. Place a __________ inch towel roll under your knee so that the knee slightly bends.  Raise only your lower leg by tightening the muscles in the front of your thigh. Do not allow your thigh to rise.  Hold this position for __________ seconds. Repeat __________ times. Complete this exercise __________ times per day.  OPTIONAL ANKLE WEIGHTS: Begin with ____________________, but DO NOT exceed ____________________. Increase in 1 pound/0.5 kilogram increments.  STRENGTH - Quadriceps, Straight Leg Raises  Quality counts! Watch for signs that the quadriceps muscle is  working to insure you are strengthening the correct muscles and not "cheating" by substituting with healthier muscles.  Lay on your back with your right / left leg extended and your opposite knee bent.  Tense the muscles in the front of your right / left thigh. You should see either your knee cap slide up or increased dimpling just above the knee. Your thigh may even quiver.  Tighten these muscles even more and raise your leg 4 to 6 inches off the floor. Hold for __________ seconds.  Keeping these muscles tense, lower your leg.  Relax the muscles slowly and completely in between each repetition. Repeat __________ times. Complete this exercise __________ times per day.  STRENGTH - Hamstring, Curls  Lay on your stomach with your legs extended. (If you lay on a bed, your feet may hang over the edge.)  Tighten the muscles in the back of your thigh to bend your right / left knee up to 90 degrees. Keep your hips flat on the bed/floor.  Hold this position for __________ seconds.  Slowly lower your leg back to the starting position. Repeat __________ times. Complete this exercise __________ times per day.  OPTIONAL ANKLE WEIGHTS: Begin with ____________________, but DO  NOT exceed ____________________. Increase in 1 pound/0.5 kilogram increments.  STRENGTH - Quadriceps, Squats  Stand in a door frame so that your feet and knees are in line with the frame.  Use your hands for balance, not support, on the frame.  Slowly lower your weight, bending at the hips and knees. Keep your lower legs upright so that they are parallel with the door frame. Squat only within the range that does not increase your knee pain. Never let your hips drop below your knees.  Slowly return upright, pushing with your legs, not pulling with your hands. Repeat __________ times. Complete this exercise __________ times per day.  STRENGTH - Quadriceps, Wall Slides  Follow guidelines for form closely. Increased knee pain  often results from poorly placed feet or knees.  Lean against a smooth wall or door and walk your feet out 18-24 inches. Place your feet hip-width apart.  Slowly slide down the wall or door until your knees bend __________ degrees.* Keep your knees over your heels, not your toes, and in line with your hips, not falling to either side.  Hold for __________ seconds. Stand up to rest for __________ seconds in between each repetition. Repeat __________ times. Complete this exercise __________ times per day. * Your physician, physical therapist, or athletic trainer will alter this angle based on your symptoms and progress.   This information is not intended to replace advice given to you by your health care provider. Make sure you discuss any questions you have with your health care provider.   Document Released: 09/06/2005 Document Revised: 11/13/2014 Document Reviewed: 02/04/2009 Elsevier Interactive Patient Education [employer]2016 [employer] Inc.

## 2016-04-27 NOTE — Progress Notes (Signed)
Re establish care  Pain scale #3 chronic knee pain  No suicidal thoughts in the past two weeks  Tobacco user 4 cigarette per day

## 2016-04-30 NOTE — Assessment & Plan Note (Signed)
Chronic pain since 2010 Sports medicine referral for possible US of knee

## 2016-04-30 NOTE — Assessment & Plan Note (Signed)
Seizure free Refilled depakote

## 2016-04-30 NOTE — Assessment & Plan Note (Signed)
Desires to quit Smoking cessation addressed

## 2016-05-10 ENCOUNTER — Emergency Department (HOSPITAL_COMMUNITY): Payer: Self-pay

## 2016-05-10 ENCOUNTER — Emergency Department (HOSPITAL_COMMUNITY)
Admission: EM | Admit: 2016-05-10 | Discharge: 2016-05-10 | Disposition: A | Discharge status: Home / Self Care | Payer: Self-pay | Attending: Emergency Medicine | Admitting: Emergency Medicine

## 2016-05-10 ENCOUNTER — Encounter (HOSPITAL_COMMUNITY): Payer: Self-pay | Admitting: *Deleted

## 2016-05-10 DIAGNOSIS — F17210 Nicotine dependence, cigarettes, uncomplicated: Secondary | ICD-10-CM | POA: Insufficient documentation

## 2016-05-10 DIAGNOSIS — J189 Pneumonia, unspecified organism: Secondary | ICD-10-CM

## 2016-05-10 DIAGNOSIS — Z8669 Personal history of other diseases of the nervous system and sense organs: Secondary | ICD-10-CM | POA: Insufficient documentation

## 2016-05-10 DIAGNOSIS — J45909 Unspecified asthma, uncomplicated: Secondary | ICD-10-CM | POA: Insufficient documentation

## 2016-05-10 DIAGNOSIS — Z79899 Other long term (current) drug therapy: Secondary | ICD-10-CM | POA: Insufficient documentation

## 2016-05-10 LAB — CBC WITH DIFFERENTIAL/PLATELET
BASOS ABS: 0 10*3/uL (ref 0.0–0.1)
Basophils Relative: 0 %
EOS PCT: 0 %
Eosinophils Absolute: 0 10*3/uL (ref 0.0–0.7)
HCT: 40.2 % (ref 39.0–52.0)
Hemoglobin: 13.8 g/dL (ref 13.0–17.0)
Lymphocytes Relative: 26 %
Lymphs Abs: 1.6 10*3/uL (ref 0.7–4.0)
MCH: 30.6 pg (ref 26.0–34.0)
MCHC: 34.3 g/dL (ref 30.0–36.0)
MCV: 89.1 fL (ref 78.0–100.0)
MONO ABS: 0.7 10*3/uL (ref 0.1–1.0)
Monocytes Relative: 12 %
Neutro Abs: 3.9 10*3/uL (ref 1.7–7.7)
Neutrophils Relative %: 62 %
PLATELETS: 140 10*3/uL — AB (ref 150–400)
RBC: 4.51 MIL/uL (ref 4.22–5.81)
RDW: 14.1 % (ref 11.5–15.5)
WBC: 6.2 10*3/uL (ref 4.0–10.5)

## 2016-05-10 LAB — I-STAT CHEM 8, ED
BUN: 4 mg/dL — ABNORMAL LOW (ref 6–20)
Calcium, Ion: 1.08 mmol/L — ABNORMAL LOW (ref 1.13–1.30)
Chloride: 98 mmol/L — ABNORMAL LOW (ref 101–111)
Creatinine, Ser: 0.9 mg/dL (ref 0.61–1.24)
Glucose, Bld: 104 mg/dL — ABNORMAL HIGH (ref 65–99)
HCT: 43 % (ref 39.0–52.0)
HEMOGLOBIN: 14.6 g/dL (ref 13.0–17.0)
POTASSIUM: 3.5 mmol/L (ref 3.5–5.1)
Sodium: 136 mmol/L (ref 135–145)
TCO2: 26 mmol/L (ref 0–100)

## 2016-05-10 MED ORDER — ACETAMINOPHEN 325 MG PO TABS: 650.0000 mg | ORAL_TABLET | Freq: Once | ORAL | Status: DC | PRN

## 2016-05-10 MED ORDER — AZITHROMYCIN 250 MG PO TABS
500.0000 mg | ORAL_TABLET | Freq: Once | ORAL | Status: AC
  Administered 2016-05-10: 500 mg via ORAL
  Filled 2016-05-10: qty 2

## 2016-05-10 MED ORDER — AZITHROMYCIN 250 MG PO TABS: 250.0000 mg | ORAL_TABLET | Freq: Every day | ORAL | Status: DC

## 2016-05-10 MED ORDER — ACETAMINOPHEN 500 MG PO TABS
1000.0000 mg | ORAL_TABLET | Freq: Once | ORAL | Status: AC
  Administered 2016-05-10: 1000 mg via ORAL
  Filled 2016-05-10: qty 2

## 2016-05-10 MED ORDER — SODIUM CHLORIDE 0.9 % IV BOLUS (SEPSIS)
1000.0000 mL | Freq: Once | INTRAVENOUS | Status: AC
  Administered 2016-05-10: 1000 mL via INTRAVENOUS

## 2016-05-10 MED ORDER — KETOROLAC TROMETHAMINE 30 MG/ML IJ SOLN
30.0000 mg | Freq: Once | INTRAMUSCULAR | Status: AC
  Administered 2016-05-10: 30 mg via INTRAVENOUS
  Filled 2016-05-10: qty 1

## 2016-05-10 MED FILL — AZITHROMYCIN 250 MG TABLET: 250 | 4 days supply | Qty: 4 | Fill #0

## 2016-05-10 NOTE — ED Notes (Signed)
Patient transported to X-ray 

## 2016-05-10 NOTE — ED Notes (Signed)
Pt c/o generalized weakness x 2 days that has progressively worsened.  C/o nausea, denies vomiting.  C/o left mid-back pain with dep inspirations and certain movement.

## 2016-05-10 NOTE — ED Notes (Signed)
Pt c/o fever, chills, sweating, body aches, general malaise, nausea and not feeling well for 2 days; pt states that he slept all day yesterday; pt reports minimal cough and states "it hurts when I cough" pt c/o nausea with no vomiting

## 2016-05-10 NOTE — ED Provider Notes (Signed)
CSN: [id number]     Arrival date & time 05/10/16  0119 History  By signing my name below, I, Majel HomerPeyton Lee, attest that this documentation has been prepared under the direction and in the presence of Tomasita CrumbleAdeleke Merton Wadlow, MD . Electronically Signed: Majel HomerPeyton Lee, Scribe. 05/10/2016. 2:55 AM.   Chief Complaint  Patient presents with  . Generalized Body Aches   The history is provided by the patient. No language interpreter was used.   HPI Comments: Donald Travis is a 30 y.o. male who presents to the Emergency Department complaining of gradually worsening, fever and left-sided back that began 3 days ago but worsened today. Pt reports tmax of 101. Pt notes associated symptoms of pleuritic chest pain and appetite change. Pt notes his pain is exacerbated when changing positions or with certain movements. Pt denies sick contacts and coughing. He also denies vomiting, diarrhea, and difficulty urinating. Pt reports he took Ibuprofen ~6 hours ago with no relief.   Past Medical History  Diagnosis Date  . Epilepsy (HCC) 1993  . Asthma     in childhood, no asthma problems since age 30   . Seizure [facility](HCC)    Past Surgical History  Procedure Laterality Date  . Tonsillectomy  ?     as a childhood    Family History  Problem Relation Age of Onset  . Adopted: Yes  . Family history unknown: Yes   Social History  Substance Use Topics  . Smoking status: Current Some Day Smoker -- 0.25 packs/day for 5 years    Types: Cigarettes  . Smokeless tobacco: Never Used  . Alcohol Use: 0.0 oz/week    0 Standard drinks or equivalent per week     Comment: socially, evevey couple of weekes     Review of Systems  Constitutional: Positive for fever.  Respiratory: Negative for cough.   Gastrointestinal: Negative for vomiting and diarrhea.  Genitourinary: Negative for difficulty urinating.  Musculoskeletal: Positive for back pain.   Allergies  Review of patient's allergies indicates no known allergies.  Home Medications    Prior to Admission medications   Medication Sig Start Date End Date Taking? Authorizing Provider  DEPAKOTE ER 500 MG 24 hr tablet TAKE 4 TABLETS BY MOUTH DAILY 02/10/16  Yes Josalyn Funches, MD  ibuprofen (ADVIL,MOTRIN) 200 MG tablet Take 400 mg by mouth every 6 (six) hours as needed for headache, mild pain or moderate pain.   Yes Historical Provider, MD  Multiple Vitamin (MULTIVITAMIN WITH MINERALS) TABS tablet Take 1 tablet by mouth daily.   Yes Historical Provider, MD  ibuprofen (ADVIL,MOTRIN) 400 MG tablet Take 1 tablet (400 mg total) by mouth every 8 (eight) hours as needed. Patient not taking: Reported on 05/10/2016 02/11/15   Marissa Sciacca, PA-C   BP 147/84 mmHg  Pulse 108  Temp(Src) 102.7 F (39.3 C) (Oral)  Resp 22  Ht 5\' 9"  (1.753 m)  Wt 222 lb (100.699 kg)  BMI 32.77 kg/m2  SpO2 98% Physical Exam  Constitutional: He is oriented to person, place, and time. Vital signs are normal. He appears well-developed and well-nourished.  Non-toxic appearance. He does not appear ill. No distress.  HENT:  Head: Normocephalic and atraumatic.  Nose: Nose normal.  Mouth/Throat: Oropharynx is clear and moist. No oropharyngeal exudate.  Eyes: Conjunctivae and EOM are normal. Pupils are equal, round, and reactive to light. No scleral icterus.  Neck: Normal range of motion. Neck supple. No tracheal deviation, no edema, no erythema and normal range of motion present. No  thyroid mass and no thyromegaly present.  Cardiovascular: Normal rate, S1 normal, S2 normal, normal heart sounds, intact distal pulses and normal pulses.  Exam reveals no gallop and no friction rub.   No murmur heard. Tachycardia  Pulmonary/Chest: Effort normal and breath sounds normal. No respiratory distress. He has no wheezes. He has no rhonchi. He has no rales.  Abdominal: Soft. Normal appearance and bowel sounds are normal. He exhibits no distension, no ascites and no mass. There is no hepatosplenomegaly. There is no tenderness.  There is no rebound, no guarding and no CVA tenderness.  Musculoskeletal: Normal range of motion. He exhibits no edema or tenderness.  Lymphadenopathy:    He has no cervical adenopathy.  Neurological: He is alert and oriented to person, place, and time. He has normal strength. No cranial nerve deficit or sensory deficit.  Skin: Skin is warm, dry and intact. No petechiae and no rash noted. He is not diaphoretic. No erythema. No pallor.  Tactile fever  Psychiatric: He has a normal mood and affect. His behavior is normal. Judgment normal.  Nursing note and vitals reviewed.   ED Course  Procedures  DIAGNOSTIC STUDIES:  Oxygen Saturation is 98% on RA, normal by my interpretation.    COORDINATION OF CARE:  2:44 AM Discussed treatment plan with pt at bedside and pt agreed to plan.  Labs Review Labs Reviewed  CBC WITH DIFFERENTIAL/PLATELET - Abnormal; Notable for the following:    Platelets 140 (*)    All other components within normal limits  I-STAT CHEM 8, ED - Abnormal; Notable for the following:    Chloride 98 (*)    BUN 4 (*)    Glucose, Bld 104 (*)    Calcium, Ion 1.08 (*)    All other components within normal limits    Imaging Review Dg Chest 2 View  05/10/2016  CLINICAL DATA:  Fever, upper back pain pain and shortness of breath for 2 days. EXAM: CHEST  2 VIEW COMPARISON:  None. FINDINGS: Cardiomediastinal silhouette is normal. The lungs are clear without pleural effusions or focal consolidations. Trachea projects midline and there is no pneumothorax. Soft tissue planes and included osseous structures are non-suspicious. IMPRESSION: Normal chest. Electronically Signed   By: Awilda Metroourtnay  Bloomer M.D.   On: 05/10/2016 03:02   I have personally reviewed and evaluated these images and lab results as part of my medical decision-making.   EKG Interpretation None      MDM   Final diagnoses:  None   Patient presents to the ED for fever, cough, and pleuritic chest pain.  He was  given IVF, tylenol and toradol for his symptoms.  His symptoms are consistent with pneumonia although CXR is negative.  Will treat with 5 days of azithromycin and advise on PCP fu within 3 days.  He feels better after IVF and medications.  VS remain within his normal limits, tachycardia and fever have resolved, and he is safe for DC.   I personally performed the services described in this documentation, which was scribed in my presence. The recorded information has been reviewed and is accurate.      Tomasita CrumbleAdeleke Jamicah Anstead, MD 05/10/16 [phone]

## 2016-05-10 NOTE — Discharge Instructions (Signed)
Community-Acquired Pneumonia, Adult Donald Travis, take antibiotics for possible pneumonia and see a primary care doctor within 3 days for close follow up. Use tylenol or ibuprofen for fever. If symptoms worsen, come back to the ED immediately. Thank you. Pneumonia is an infection of the lungs. One type of pneumonia can happen while a person is in a hospital. A different type can happen when a person is not in a hospital (community-acquired pneumonia). It is easy for this kind to spread from person to person. It can spread to you if you breathe near an infected person who coughs or sneezes. Some symptoms include:  A dry cough.  A wet (productive) cough.  Fever.  Sweating.  Chest pain. HOME CARE  Take over-the-counter and prescription medicines only as told by your doctor.  Only take cough medicine if you are losing sleep.  If you were prescribed an antibiotic medicine, take it as told by your doctor. Do not stop taking the antibiotic even if you start to feel better.  Sleep with your head and neck raised (elevated). You can do this by putting a few pillows under your head, or you can sleep in a recliner.  Do not use tobacco products. These include cigarettes, chewing tobacco, and e-cigarettes. If you need help quitting, ask your doctor.  Drink enough water to keep your pee (urine) clear or pale yellow. A shot (vaccine) can help prevent pneumonia. Shots are often suggested for:  People older than 32 years of age.  People older than 32 years of age:  Who are having cancer treatment.  Who have long-term (chronic) lung disease.  Who have problems with their body's defense system (immune system). You may also prevent pneumonia if you take these actions:  Get the flu (influenza) shot every year.  Go to the dentist as often as told.  Wash your hands often. If soap and water are not available, use hand sanitizer. GET HELP IF:  You have a fever.  You lose sleep because your cough  medicine does not help. GET HELP RIGHT AWAY IF:  You are short of breath and it gets worse.  You have more chest pain.  Your sickness gets worse. This is very serious if:  You are an older adult.  Your body's defense system is weak.  You cough up blood.   This information is not intended to replace advice given to you by your health care provider. Make sure you discuss any questions you have with your health care provider.   Document Released: 04/10/2008 Document Revised: 07/14/2015 Document Reviewed: 02/17/2015 Elsevier Interactive Patient Education [employer]2016 Elsevier Inc.

## 2016-05-23 ENCOUNTER — Inpatient Hospital Stay: Payer: Self-pay | Admitting: Family Medicine

## 2016-05-29 MED FILL — ?DIVALPROEX SOD ER 500 MG T: 500 MG | 30 days supply | Qty: 120 | Fill #4

## 2016-07-07 MED FILL — ?DIVALPROEX SOD ER 500 MG T: 500 MG | 30 days supply | Qty: 120 | Fill #5

## 2016-08-10 MED FILL — !DEPAKOTE 500 MG ER TABLET: 500 | 30 days supply | Qty: 120 | Fill #6

## 2016-09-13 MED FILL — !DEPAKOTE 500 MG ER TABLET: 500 | 30 days supply | Qty: 120 | Fill #7

## 2016-10-10 MED FILL — !DEPAKOTE 500 MG ER TABLET: 500 | 30 days supply | Qty: 120 | Fill #8

## 2016-10-25 ENCOUNTER — Emergency Department (HOSPITAL_COMMUNITY)
Admission: EM | Admit: 2016-10-25 | Discharge: 2016-10-25 | Disposition: A | Discharge status: Home / Self Care | Payer: Self-pay | Attending: Emergency Medicine | Admitting: Emergency Medicine

## 2016-10-25 ENCOUNTER — Encounter (HOSPITAL_COMMUNITY): Payer: Self-pay | Admitting: Emergency Medicine

## 2016-10-25 DIAGNOSIS — Z79899 Other long term (current) drug therapy: Secondary | ICD-10-CM | POA: Insufficient documentation

## 2016-10-25 DIAGNOSIS — R05 Cough: Secondary | ICD-10-CM | POA: Insufficient documentation

## 2016-10-25 DIAGNOSIS — R059 Cough, unspecified: Secondary | ICD-10-CM

## 2016-10-25 DIAGNOSIS — J45909 Unspecified asthma, uncomplicated: Secondary | ICD-10-CM | POA: Insufficient documentation

## 2016-10-25 DIAGNOSIS — F17210 Nicotine dependence, cigarettes, uncomplicated: Secondary | ICD-10-CM | POA: Insufficient documentation

## 2016-10-25 MED ORDER — FLUTICASONE PROPIONATE 50 MCG/ACT NA SUSP: 2.0000 | Freq: Every day | NASAL | 0 refills | Status: DC

## 2016-10-25 MED ORDER — GUAIFENESIN 100 MG/5ML PO LIQD: 100.0000 mg | ORAL | 0 refills | Status: DC | PRN

## 2016-10-25 MED ORDER — BENZONATATE 100 MG PO CAPS: 100.0000 mg | ORAL_CAPSULE | Freq: Three times a day (TID) | ORAL | 0 refills | Status: DC

## 2016-10-25 MED ORDER — OSELTAMIVIR PHOSPHATE 75 MG PO CAPS: 75.0000 mg | ORAL_CAPSULE | Freq: Two times a day (BID) | ORAL | 0 refills | Status: DC

## 2016-10-25 NOTE — Discharge Instructions (Signed)
Please read the attached information on "cough" and "influenza".   You have been preseribed tamiflu for the flu, please take this medication as instructed.    You have also been prescribed flonase, mucinex and tessalon.  These medication will help with nasal congestion, cough and phlegm.  Please take these as instructed.   Return to emergency department if your symptoms worsen or do not improve in the next 7-10 days.    Follow up with your primary care doctor as needed.

## 2016-10-25 NOTE — ED Triage Notes (Signed)
Patient is complaining of productive cough, sore throat, body aches, fatigue, fever x3 days. States he did not receive the flu shot this year.

## 2016-10-26 NOTE — ED Provider Notes (Signed)
[facility]-EMERGENCY DEPT Provider Note   CSN: [id number] Arrival date & time: 10/25/16  1835     History   Chief Complaint Chief Complaint  Patient presents with  . Cough  . Sore Throat    HPI Donald Travis is a 30 y.o. male with pmh of asthma and seizures who presents with productive cough, sore throat, nasal congestion, fevers, body aches and fatigue x 2 days.  Pt states his mother came to hospital and was diagnosed with the flu yesterday.  Pt lives with his mother.  Pt states he has been taking theraflu and nyquil, pt states he took both theraflu and nyquil last night and slept for 18 hours today.   HPI  Past Medical History:  Diagnosis Date  . Asthma    in childhood, no asthma problems since age 30   . Epilepsy (HCC) 1993  . Seizure [facility](HCC)     Patient Active Problem List   Diagnosis Date Noted  . Chronic patellofemoral pain of right knee 04/27/2016  . Current smoker 12/03/2014  . Epilepsy [facility](HCC)     Past Surgical History:  Procedure Laterality Date  . TONSILLECTOMY  ?    as a childhood        Home Medications    Prior to Admission medications   Medication Sig Start Date End Date Taking? Authorizing Provider  azithromycin (ZITHROMAX) 250 MG tablet Take 1 tablet (250 mg total) by mouth daily. 05/10/16   Tomasita CrumbleAdeleke Oni, MD  benzonatate (TESSALON) 100 MG capsule Take 1 capsule (100 mg total) by mouth every 8 (eight) hours. 10/25/16   Liberty Handylaudia J Neyland Pettengill, PA-C  DEPAKOTE ER 500 MG 24 hr tablet TAKE 4 TABLETS BY MOUTH DAILY 02/10/16   Dessa PhiJosalyn Funches, MD  fluticasone (FLONASE) 50 MCG/ACT nasal spray Place 2 sprays into both nostrils daily. 10/25/16   Liberty Handylaudia J Morris Markham, PA-C  guaiFENesin (ROBITUSSIN) 100 MG/5ML liquid Take 5-10 mLs (100-200 mg total) by mouth every 4 (four) hours as needed for cough. 10/25/16   Liberty Handylaudia J Apryl Brymer, PA-C  Multiple Vitamin (MULTIVITAMIN WITH MINERALS) TABS tablet Take 1 tablet by mouth daily.    Historical Provider, MD  oseltamivir (TAMIFLU) 75 MG  capsule Take 1 capsule (75 mg total) by mouth every 12 (twelve) hours. 10/25/16   Liberty Handylaudia J Kissy Cielo, PA-C    Family History Family History  Problem Relation Age of Onset  . Adopted: Yes  . Family history unknown: Yes    Social History Social History  Substance Use Topics  . Smoking status: Current Some Day Smoker    Packs/day: 0.25    Years: 5.00    Types: Cigarettes  . Smokeless tobacco: Never Used  . Alcohol use 0.0 oz/week     Comment: socially, evevey couple of weekes      Allergies   Patient has no known allergies.   Review of Systems Review of Systems  Constitutional: Positive for chills, fatigue and fever.  HENT: Positive for congestion, postnasal drip, rhinorrhea and sore throat. Negative for ear pain, sinus pain and sinus pressure.   Eyes: Negative for visual disturbance.  Respiratory: Positive for cough. Negative for shortness of breath and wheezing.   Cardiovascular: Negative for chest pain, palpitations and leg swelling.  Gastrointestinal: Positive for abdominal pain and nausea. Negative for constipation, diarrhea and vomiting.  Genitourinary: Negative for difficulty urinating.  Musculoskeletal: Positive for myalgias.  Skin: Negative for rash.  Allergic/Immunologic: Negative for immunocompromised state.  Neurological: Positive for headaches.  Psychiatric/Behavioral: The patient is not nervous/anxious.  Physical Exam Updated Vital Signs BP 136/94 (BP Location: Right Arm)   Pulse 62   Temp 98.2 F (36.8 C) (Oral)   Resp 18   Ht 5\' 9"  (1.753 m)   Wt 100.7 kg   SpO2 100%   BMI 32.78 kg/m   Physical Exam  Constitutional: He is oriented to person, place, and time. Vital signs are normal. He appears well-developed and well-nourished. No distress.  HENT:  Head: Normocephalic and atraumatic.  Mildly edematous and erythematous inferior turbinates, no nasal discharge. Oropharynx clear and moist w/o exudates.   Eyes: EOM are normal. Pupils are equal,  round, and reactive to light.  Neck: Normal range of motion. Neck supple.  No cervical adenopathy.  Cardiovascular: Normal rate, regular rhythm and normal heart sounds.   Pulmonary/Chest:  Effort normal, pt speaking in full sentences.  Lungs CTAB without wheezing, rhonchi or rales.    Abdominal: Soft. He exhibits no distension. There is no tenderness.  Musculoskeletal: Normal range of motion.  Neurological: He is alert and oriented to person, place, and time.  Skin: Skin is warm and dry. No rash noted.  Psychiatric: He has a normal mood and affect. His behavior is normal.  Nursing note and vitals reviewed.   ED Treatments / Results  Labs (all labs ordered are listed, but only abnormal results are displayed) Labs Reviewed - No data to display  EKG  EKG Interpretation None       Radiology No results found.  Procedures Procedures (including critical care time)  Medications Ordered in ED Medications - No data to display   Initial Impression / Assessment and Plan / ED Course  I have reviewed the triage vital signs and the nursing notes.  Pertinent labs & imaging results that were available during my care of the patient were reviewed by me and considered in my medical decision making (see chart for details).  Clinical Course    Pt symptoms and physical exam findings most c/w upper respiratory infection likely viral, possibly influenza.  Given symptoms x 48 hours and mother diagnosed with influenza yesterday will treat pt for flu.  Pt discharged with prescription for tamiflu, flonase, tessalon, mucinex.  Pt instructed to use home albuterol prn.  Pt structed to take ibuprofen/tylenol for body aches and fever.  ED return instructions given.  Pt verbalized understanding.    Final Clinical Impressions(s) / ED Diagnoses   Final diagnoses:  Cough    New Prescriptions Discharge Medication List as of 10/25/2016 10:17 PM    START taking these medications   Details  benzonatate  (TESSALON) 100 MG capsule Take 1 capsule (100 mg total) by mouth every 8 (eight) hours., Starting Wed 10/25/2016, Print    fluticasone (FLONASE) 50 MCG/ACT nasal spray Place 2 sprays into both nostrils daily., Starting Wed 10/25/2016, Print    guaiFENesin (ROBITUSSIN) 100 MG/5ML liquid Take 5-10 mLs (100-200 mg total) by mouth every 4 (four) hours as needed for cough., Starting Wed 10/25/2016, Print    oseltamivir (TAMIFLU) 75 MG capsule Take 1 capsule (75 mg total) by mouth every 12 (twelve) hours., Starting Wed 10/25/2016, Print         Liberty HandyClaudia J Lissett Favorite, PA-C 10/26/16 1050    Melene Planan Floyd, DO 10/26/16 [phone]

## 2016-11-10 MED FILL — DIVALPROEX SOD ER 500 MG TA: 500 | 30 days supply | Qty: 120 | Fill #9

## 2016-12-20 MED FILL — DIVALPROEX SOD ER 500 MG TA: 500 | 30 days supply | Qty: 120 | Fill #10

## 2017-01-09 ENCOUNTER — Encounter (HOSPITAL_COMMUNITY): Payer: Self-pay | Admitting: Emergency Medicine

## 2017-01-09 ENCOUNTER — Emergency Department (HOSPITAL_COMMUNITY)
Admission: EM | Admit: 2017-01-09 | Discharge: 2017-01-09 | Disposition: A | Discharge status: Home / Self Care | Payer: Self-pay | Attending: Dermatology | Admitting: Dermatology

## 2017-01-09 DIAGNOSIS — Z5321 Procedure and treatment not carried out due to patient leaving prior to being seen by health care provider: Secondary | ICD-10-CM | POA: Insufficient documentation

## 2017-01-09 DIAGNOSIS — R1909 Other intra-abdominal and pelvic swelling, mass and lump: Secondary | ICD-10-CM | POA: Insufficient documentation

## 2017-01-09 NOTE — ED Triage Notes (Signed)
Pt reports lower abd pain , nausea and emesis, fever x 2 days. Today he noticed swollen penis. Denies penile discharge . denies dysuria nor hematuria. Sts unprotected intercourse 4-5 days ago.

## 2017-01-10 ENCOUNTER — Encounter (HOSPITAL_COMMUNITY): Payer: Self-pay | Admitting: Emergency Medicine

## 2017-01-10 ENCOUNTER — Emergency Department (HOSPITAL_COMMUNITY)
Admission: EM | Admit: 2017-01-10 | Discharge: 2017-01-10 | Disposition: A | Discharge status: Home / Self Care | Payer: Self-pay | Attending: Emergency Medicine | Admitting: Emergency Medicine

## 2017-01-10 DIAGNOSIS — J45909 Unspecified asthma, uncomplicated: Secondary | ICD-10-CM | POA: Insufficient documentation

## 2017-01-10 DIAGNOSIS — Z202 Contact with and (suspected) exposure to infections with a predominantly sexual mode of transmission: Secondary | ICD-10-CM | POA: Insufficient documentation

## 2017-01-10 DIAGNOSIS — Z711 Person with feared health complaint in whom no diagnosis is made: Secondary | ICD-10-CM

## 2017-01-10 DIAGNOSIS — Z79899 Other long term (current) drug therapy: Secondary | ICD-10-CM | POA: Insufficient documentation

## 2017-01-10 DIAGNOSIS — F17210 Nicotine dependence, cigarettes, uncomplicated: Secondary | ICD-10-CM | POA: Insufficient documentation

## 2017-01-10 LAB — URINALYSIS, ROUTINE W REFLEX MICROSCOPIC
Bacteria, UA: NONE SEEN
Bilirubin Urine: NEGATIVE
GLUCOSE, UA: NEGATIVE mg/dL
Hgb urine dipstick: NEGATIVE
Ketones, ur: 5 mg/dL — AB
Leukocytes, UA: NEGATIVE
Nitrite: NEGATIVE
PH: 5 (ref 5.0–8.0)
Protein, ur: 30 mg/dL — AB
SPECIFIC GRAVITY, URINE: 1.028 (ref 1.005–1.030)

## 2017-01-10 LAB — RAPID HIV SCREEN (HIV 1/2 AB+AG)
HIV 1/2 ANTIBODIES: NONREACTIVE
HIV-1 P24 Antigen - HIV24: NONREACTIVE

## 2017-01-10 MED ORDER — IBUPROFEN 800 MG PO TABS
800.0000 mg | ORAL_TABLET | Freq: Once | ORAL | Status: AC
  Administered 2017-01-10: 800 mg via ORAL
  Filled 2017-01-10: qty 1

## 2017-01-10 MED ORDER — IBUPROFEN 600 MG PO TABS: 600.0000 mg | ORAL_TABLET | Freq: Four times a day (QID) | ORAL | 0 refills | Status: AC | PRN

## 2017-01-10 MED ORDER — ONDANSETRON HCL 4 MG PO TABS: 4.0000 mg | ORAL_TABLET | Freq: Four times a day (QID) | ORAL | 0 refills | Status: DC

## 2017-01-10 NOTE — ED Notes (Signed)
HIV test Non-reactive reported to Tranm  PA.

## 2017-01-10 NOTE — Discharge Instructions (Signed)
You have been screened for potential STD.  If your tests are abnormal, you will be contacted in the next several days and will be given appropriate treatment.  Avoid sexual activities until your symptoms completely resolved.

## 2017-01-10 NOTE — ED Notes (Signed)
Asked pt if he could give a urine sample, but said he's not able to. 

## 2017-01-10 NOTE — ED Triage Notes (Signed)
Pt reports rash on penis onset x1 day denies pain, dysuria or discharge

## 2017-01-10 NOTE — ED Provider Notes (Signed)
[facility]-EMERGENCY DEPT Provider Note   CSN: [id number] Arrival date & time: 01/10/17  0135     History   Chief Complaint Chief Complaint  Patient presents with  . Exposure to STD    HPI Donald Travis is a 31 y.o. male.  HPI  31 year old male with hx of epilepsy presenting with complaints of discomfort in penis. Patient had a viral illness 5 days ago with associate nausea vomiting diarrhea. That has since improved. 3 days ago he noticed swelling and pimples the shaft of penis without any penile discharge, hematuria, or dysuria. Patient states he is currently in a monogamous relationship and partner does not have any symptoms however he does voice concern for potential STD. He did report some mild groin Discomfort. Report prior history of gonorrhea infection. He denies having any fever, chills, chest pain, shortness of breath.The rash is nontender. Patient does not wear protection during sexual intercourse. No other complaint.      Past Medical History:  Diagnosis Date  . Asthma    in childhood, no asthma problems since age 31   . Epilepsy (HCC) 1993  . Seizure [facility](HCC)     Patient Active Problem List   Diagnosis Date Noted  . Chronic patellofemoral pain of right knee 04/27/2016  . Current smoker 12/03/2014  . Epilepsy [facility](HCC)     Past Surgical History:  Procedure Laterality Date  . TONSILLECTOMY  ?    as a childhood        Home Medications    Prior to Admission medications   Medication Sig Start Date End Date Taking? Authorizing Provider  azithromycin (ZITHROMAX) 250 MG tablet Take 1 tablet (250 mg total) by mouth daily. 05/10/16   Tomasita CrumbleAdeleke Oni, MD  benzonatate (TESSALON) 100 MG capsule Take 1 capsule (100 mg total) by mouth every 8 (eight) hours. 10/25/16   Liberty Handylaudia J Gibbons, PA-C  DEPAKOTE ER 500 MG 24 hr tablet TAKE 4 TABLETS BY MOUTH DAILY 02/10/16   Dessa PhiJosalyn Funches, MD  fluticasone (FLONASE) 50 MCG/ACT nasal spray Place 2 sprays into both nostrils daily. 10/25/16    Liberty Handylaudia J Gibbons, PA-C  guaiFENesin (ROBITUSSIN) 100 MG/5ML liquid Take 5-10 mLs (100-200 mg total) by mouth every 4 (four) hours as needed for cough. 10/25/16   Liberty Handylaudia J Gibbons, PA-C  Multiple Vitamin (MULTIVITAMIN WITH MINERALS) TABS tablet Take 1 tablet by mouth daily.    Historical Provider, MD  oseltamivir (TAMIFLU) 75 MG capsule Take 1 capsule (75 mg total) by mouth every 12 (twelve) hours. 10/25/16   Liberty Handylaudia J Gibbons, PA-C    Family History Family History  Problem Relation Age of Onset  . Adopted: Yes  . Family history unknown: Yes    Social History Social History  Substance Use Topics  . Smoking status: Current Some Day Smoker    Packs/day: 0.25    Years: 5.00    Types: Cigarettes  . Smokeless tobacco: Never Used  . Alcohol use 0.0 oz/week     Comment: socially, evevey couple of weekes      Allergies   Patient has no known allergies.   Review of Systems Review of Systems  All other systems reviewed and are negative.    Physical Exam Updated Vital Signs BP 144/89 (BP Location: Right Arm)   Pulse 77   Temp 97.7 F (36.5 C) (Oral)   Resp 16   SpO2 97%   Physical Exam  Constitutional: He appears well-developed and well-nourished. No distress.  HENT:  Head: Atraumatic.  Eyes: Conjunctivae  are normal.  Neck: Neck supple.  Cardiovascular: Normal rate and regular rhythm.   Pulmonary/Chest: Effort normal and breath sounds normal.  Abdominal: Soft. He exhibits no distension.  Mild tenderness to the right inguinal region with evidence of inguinal lymphadenopathy but no inguinal hernia noted.  Genitourinary:  Genitourinary Comments: Chaperone present during exam. Circumcised penis with mild swelling noted to the redundant skin adjacent to the left side of corona, without any surrounding skin erythema, nontender, no blister or ulceration noted. Normal glans penis without any discharge. Testicle with normal lie, scrotum soft and nontender, perineum is soft, no  evidence of inguinal hernia.  Neurological: He is alert.  Skin: No rash noted.  Psychiatric: He has a normal mood and affect.  Nursing note and vitals reviewed.    ED Treatments / Results  Labs (all labs ordered are listed, but only abnormal results are displayed) Labs Reviewed  URINALYSIS, ROUTINE W REFLEX MICROSCOPIC - Abnormal; Notable for the following:       Result Value   Ketones, ur 5 (*)    Protein, ur 30 (*)    Squamous Epithelial / LPF 0-5 (*)    All other components within normal limits  RAPID HIV SCREEN (HIV 1/2 AB+AG)  RPR  GC/CHLAMYDIA PROBE AMP ([place]) NOT AT [facility]    EKG  EKG Interpretation None       Radiology No results found.  Procedures Procedures (including critical care time)  Medications Ordered in ED Medications  ibuprofen (ADVIL,MOTRIN) tablet 800 mg (800 mg Oral Given 01/10/17 0345)     Initial Impression / Assessment and Plan / ED Course  I have reviewed the triage vital signs and the nursing notes.  Pertinent labs & imaging results that were available during my care of the patient were reviewed by me and considered in my medical decision making (see chart for details).     BP 140/80 (BP Location: Left Arm)   Pulse 73   Temp 97.7 F (36.5 C) (Oral)   Resp 18   SpO2 99%    Final Clinical Impressions(s) / ED Diagnoses   Final diagnoses:  Concern about STD in male without diagnosis    New Prescriptions New Prescriptions   IBUPROFEN (ADVIL,MOTRIN) 600 MG TABLET    Take 1 tablet (600 mg total) by mouth every 6 (six) hours as needed.   ONDANSETRON (ZOFRAN) 4 MG TABLET    Take 1 tablet (4 mg total) by mouth every 6 (six) hours.   2:54 AM Patient here with complaints of swelling to the distal shaft of his penis near the gland without any associated pain. There are some mild swelling noted but no blister to suggest herpetic infection, no ulceration concerning for syphilis also pain going and no erythema concerning for  cellulitis. He denies any penile discharge. No evidence of Fournier gangrene. Given his concern for potential STD, and STD screening panel was obtained. He does have some reactive in the lymph nodes, ibuprofen given for pain.   Fayrene Helper, PA-C 01/10/17 1610    Devoria Albe, MD 01/10/17 [phone]

## 2017-01-10 NOTE — ED Notes (Signed)
Performed Physical exam pt has small rash, and swelling,  And has been having some abd discomfort, and has been having diarrhea x 2 days

## 2017-01-11 LAB — GC/CHLAMYDIA PROBE AMP (~~LOC~~) NOT AT ARMC
Chlamydia: NEGATIVE
Neisseria Gonorrhea: NEGATIVE

## 2017-01-11 LAB — RPR: RPR Ser Ql: NONREACTIVE

## 2017-02-02 ENCOUNTER — Encounter (HOSPITAL_COMMUNITY): Payer: Self-pay

## 2017-02-02 ENCOUNTER — Emergency Department (HOSPITAL_COMMUNITY)
Admission: EM | Admit: 2017-02-02 | Discharge: 2017-02-02 | Disposition: A | Discharge status: Home / Self Care | Payer: Self-pay | Attending: Emergency Medicine | Admitting: Emergency Medicine

## 2017-02-02 DIAGNOSIS — Z760 Encounter for issue of repeat prescription: Secondary | ICD-10-CM | POA: Insufficient documentation

## 2017-02-02 DIAGNOSIS — F17210 Nicotine dependence, cigarettes, uncomplicated: Secondary | ICD-10-CM | POA: Insufficient documentation

## 2017-02-02 DIAGNOSIS — J45909 Unspecified asthma, uncomplicated: Secondary | ICD-10-CM | POA: Insufficient documentation

## 2017-02-02 MED ORDER — DIVALPROEX SODIUM ER 500 MG PO TB24: 2000.0000 mg | ORAL_TABLET | Freq: Every day | ORAL | 0 refills | Status: DC

## 2017-02-02 MED FILL — DIVALPROEX SOD ER 500 MG TA: 500 | 5 days supply | Qty: 20 | Fill #0

## 2017-02-02 NOTE — Discharge Instructions (Signed)
Your medication has been refilled today as a courtesy, however you will need to see your regular doctor and neurologist for any future medication refills. Take medications as directed. Follow up with your regular doctor and neurologist in 1-2 weeks for ongoing management of your medical conditions. Return to the ER for emergent changes or worsening symptoms.

## 2017-02-02 NOTE — ED Triage Notes (Signed)
PT HERE FOR A MEDICATION REFILL FOR DEPAKOTE. PT STS [facility] FOR [facility] REFILL, BUT IT WAS NOT READY. THEY THEN TOLD HIM TO RETURN TODAY, BUT THEY ARE CLOSED. DENIES ANY PAIN.

## 2017-02-02 NOTE — ED Notes (Signed)
PT DISCHARGED. INSTRUCTIONS AND PRESCRIPTION GIVEN. AAOX4. PT IN NO APPARENT DISTRESS OR PAIN. THE OPPORTUNITY TO ASK QUESTIONS WAS PROVIDED. 

## 2017-02-02 NOTE — ED Provider Notes (Signed)
[facility]-EMERGENCY DEPT Provider Note   CSN: [id number] Arrival date & time: 02/02/17  1654   By signing my name below, I, Clarisse Gouge, attest that this documentation has been prepared under the direction and in the presence of [address], [employer]. Electronically Signed: Clarisse Gouge, Scribe. 02/02/17. 5:09 PM.   History   Chief Complaint Chief Complaint  Patient presents with  . Medication Refill   The history is provided by the patient and medical records. No language interpreter was used.  Medication Refill  Medications/supplies requested:  Depakote XR 500 mg (4 tabs once daily) Reason for request:  Clinic/provider not available (refill at pharmacy, which is closed) Medications taken before: yes - see home medications   Patient has complete original prescription information: yes     Donald Travis is a 31 y.o. male with a PMHx of seizures, who presents to the ED requesting a medication refill for Depakote XR 500 mg which he takes 4 tabs once daily for seizures. He states he is followed by Dr. Hyacinth Meeker of [facility], who normally prescribes the medication. He states he was unable to fill the prescription today because the pharmacy was closed so although his refill is there, he can't get to it because the pharmacy is closed. He needs enough medication to last until 02/05/2017 when they re-open. He adds he had his last dose of the medication yesterday. Pt denies recent seizures, confusion, HA, lightheadedness, vision changes, syncope, fevers, chills, CP, SOB, abd pain, N/V/D/C, hematuria, dysuria, myalgias, arthralgias, numbness, tingling, focal weakness, rashes, or any other complaints at this time.   Past Medical History:  Diagnosis Date  . Asthma    in childhood, no asthma problems since age 47   . Epilepsy (HCC) 1993  . Seizure [facility])     Patient Active Problem List   Diagnosis Date Noted  . Chronic patellofemoral pain of right knee 04/27/2016  . Current smoker 12/03/2014    . Epilepsy [facility])     Past Surgical History:  Procedure Laterality Date  . TONSILLECTOMY  ?    as a childhood        Home Medications    Prior to Admission medications   Medication Sig Start Date End Date Taking? Authorizing Provider  azithromycin (ZITHROMAX) 250 MG tablet Take 1 tablet (250 mg total) by mouth daily. Patient not taking: Reported on 01/10/2017 05/10/16   Tomasita Crumble, MD  benzonatate (TESSALON) 100 MG capsule Take 1 capsule (100 mg total) by mouth every 8 (eight) hours. Patient not taking: Reported on 01/10/2017 10/25/16   Liberty Handy, PA-C  DEPAKOTE ER 500 MG 24 hr tablet TAKE 4 TABLETS BY MOUTH DAILY 02/10/16   Dessa Phi, MD  fluticasone (FLONASE) 50 MCG/ACT nasal spray Place 2 sprays into both nostrils daily. Patient not taking: Reported on 01/10/2017 10/25/16   Liberty Handy, PA-C  guaiFENesin (ROBITUSSIN) 100 MG/5ML liquid Take 5-10 mLs (100-200 mg total) by mouth every 4 (four) hours as needed for cough. Patient not taking: Reported on 01/10/2017 10/25/16   Liberty Handy, PA-C  ibuprofen (ADVIL,MOTRIN) 600 MG tablet Take 1 tablet (600 mg total) by mouth every 6 (six) hours as needed. 01/10/17   Fayrene Helper, PA-C  ondansetron (ZOFRAN) 4 MG tablet Take 1 tablet (4 mg total) by mouth every 6 (six) hours. 01/10/17   Fayrene Helper, PA-C  oseltamivir (TAMIFLU) 75 MG capsule Take 1 capsule (75 mg total) by mouth every 12 (twelve) hours. Patient not taking: Reported on 01/10/2017 10/25/16  Liberty Handy, PA-C    Family History Family History  Problem Relation Age of Onset  . Adopted: Yes  . Family history unknown: Yes    Social History Social History  Substance Use Topics  . Smoking status: Current Some Day Smoker    Packs/day: 0.25    Years: 5.00    Types: Cigarettes  . Smokeless tobacco: Never Used  . Alcohol use 0.0 oz/week     Comment: socially, evevey couple of weekes      Allergies   Patient has no known allergies.   Review of  Systems Review of Systems  Constitutional: Negative for chills and fever.  Eyes: Negative for visual disturbance.  Respiratory: Negative for shortness of breath.   Cardiovascular: Negative for chest pain.  Gastrointestinal: Negative for abdominal pain, constipation, diarrhea, nausea and vomiting.  Genitourinary: Negative for dysuria and hematuria.  Musculoskeletal: Negative for arthralgias and myalgias.  Skin: Negative for color change.  Allergic/Immunologic: Negative for immunocompromised state.  Neurological: Negative for seizures, syncope, weakness, light-headedness, numbness and headaches.  Psychiatric/Behavioral: Negative for confusion.    10 Systems reviewed and all are negative for acute change except as noted in the HPI.   Physical Exam Updated Vital Signs BP (!) 135/91 (BP Location: Right Arm)   Pulse 90   Temp 98.2 F (36.8 C) (Oral)   Resp 17   Ht  (1.753 m)   Wt 220 lb (99.8 kg)   SpO2 98%   BMI 32.49 kg/m   Physical Exam  Constitutional: He is oriented to person, place, and time. Vital signs are normal. He appears well-developed and well-nourished.  Non-toxic appearance. No distress.  Afebrile, nontoxic, NAD  HENT:  Head: Normocephalic and atraumatic.  Mouth/Throat: Mucous membranes are normal.  Eyes: Conjunctivae and EOM are normal. Right eye exhibits no discharge. Left eye exhibits no discharge.  Neck: Normal range of motion. Neck supple.  Cardiovascular: Normal rate and intact distal pulses.   Pulmonary/Chest: Effort normal. No respiratory distress.  Abdominal: Normal appearance. He exhibits no distension.  Musculoskeletal: Normal range of motion.  Neurological: He is alert and oriented to person, place, and time. He has normal strength. No sensory deficit.  Skin: Skin is warm, dry and intact. No rash noted.  Psychiatric: He has a normal mood and affect.  Nursing note and vitals reviewed.    ED Treatments / Results  DIAGNOSTIC STUDIES: Oxygen  Saturation is 98% on RA, normal by my interpretation.    COORDINATION OF CARE: 5:10 PM Discussed treatment plan with pt at bedside and pt agreed to plan.  Labs (all labs ordered are listed, but only abnormal results are displayed) Labs Reviewed - No data to display  EKG  EKG Interpretation None       Radiology No results found.  Procedures Procedures (including critical care time)  Medications Ordered in ED Medications - No data to display   Initial Impression / Assessment and Plan / ED Course  I have reviewed the triage vital signs and the nursing notes.  Pertinent labs & imaging results that were available during my care of the patient were reviewed by me and considered in my medical decision making (see chart for details).     31 y.o. male here for med refill, pharmacy closed where he has the med refill so he needs a short supply in order to make it to Monday when they reopen it. No seizures or complaints at this time, no HA, no lightheadedness, etc. Will give  5 day supply of his usual depakote rx as a one time courtesy due to pharmacy being closed. Advised f/up with PCP and neurology for ongoing med refills and management of seizure disorder/medical conditions. I explained the diagnosis and have given explicit precautions to return to the ER including for any other new or worsening symptoms. The patient understands and accepts the medical plan as it's been dictated and I have answered their questions. Discharge instructions concerning home care and prescriptions have been given. The patient is STABLE and is discharged to home in good condition.   I personally performed the services described in this documentation, which was scribed in my presence. The recorded information has been reviewed and is accurate.   Final Clinical Impressions(s) / ED Diagnoses   Final diagnoses:  Encounter for medication refill    New Prescriptions New Prescriptions   DIVALPROEX (DEPAKOTE ER)  500 MG 24 HR TABLET    Take 4 tablets (2,000 mg total) by mouth daily.     [address] PA-C 02/02/17 1729    Raeford Razor, MD 02/03/17 0100

## 2017-02-08 MED FILL — DIVALPROEX SOD ER 500 MG TA: 500 | 30 days supply | Qty: 120 | Fill #11

## 2017-03-06 ENCOUNTER — Other Ambulatory Visit: Payer: Self-pay | Admitting: Family Medicine

## 2017-03-06 ENCOUNTER — Ambulatory Visit (HOSPITAL_COMMUNITY): Admission: EM | Admit: 2017-03-06 | Discharge: 2017-03-06 | Discharge status: Against Medical Advice | Payer: Self-pay

## 2017-03-06 DIAGNOSIS — G40802 Other epilepsy, not intractable, without status epilepticus: Secondary | ICD-10-CM

## 2017-03-06 NOTE — ED Notes (Signed)
Waiting  Room  Checked   X  2   15  mins  Apart   Pt  Called  On  Cell  Phone  To  asertain  His  Location no  Answer

## 2017-03-09 MED FILL — DIVALPROEX SOD ER 500 MG TA: 500 | 30 days supply | Qty: 120 | Fill #0

## 2017-03-21 ENCOUNTER — Encounter: Payer: Self-pay | Admitting: Family Medicine

## 2017-04-11 MED FILL — DIVALPROEX SOD ER 500 MG TA: 500 | 30 days supply | Qty: 120 | Fill #1

## 2017-05-11 MED FILL — DIVALPROEX SOD ER 500 MG TA: 500 | 30 days supply | Qty: 120 | Fill #2

## 2017-06-05 MED FILL — DIVALPROEX SOD ER 500 MG TA: 500 | 30 days supply | Qty: 120 | Fill #3

## 2017-07-13 MED FILL — DIVALPROEX SOD ER 500 MG TA: 500 | 30 days supply | Qty: 120 | Fill #4

## 2017-08-22 MED FILL — ?DIVALPROEX SOD ER 500MG TA: 500 | 30 days supply | Qty: 120 | Fill #5

## 2017-09-19 ENCOUNTER — Telehealth: Payer: Self-pay

## 2017-09-19 DIAGNOSIS — G40802 Other epilepsy, not intractable, without status epilepticus: Secondary | ICD-10-CM

## 2017-09-19 MED ORDER — DIVALPROEX SODIUM ER 500 MG PO TB24: 2000.0000 mg | ORAL_TABLET | Freq: Every day | ORAL | 0 refills | Status: DC

## 2017-09-19 NOTE — Telephone Encounter (Signed)
Pt. Called requesting a refill on divalproex (DEPAKOTE ER) 500 MG 24 hr tablet  Pt. States that he only has 2 days of medication left and has scheduled an appt. For 10/01/17. Pt. Uses [facility] pharmacy. Please f/u

## 2017-09-19 NOTE — Telephone Encounter (Signed)
Patient has not been seen since 04/2016. Will forward to provider who patient is establishing care with for approval.

## 2017-09-19 NOTE — Telephone Encounter (Signed)
Refill sent for 30 days no refill

## 2017-09-20 MED FILL — ?DIVALPROEX SOD ER 500MG TA: 500 | 30 days supply | Qty: 120 | Fill #0

## 2017-10-01 ENCOUNTER — Ambulatory Visit: Payer: Self-pay | Attending: Nurse Practitioner | Admitting: Nurse Practitioner

## 2017-10-01 ENCOUNTER — Encounter: Payer: Self-pay | Admitting: Nurse Practitioner

## 2017-10-01 VITALS — BP 138/86 | HR 86 | Temp 98.5°F | Resp 16 | Ht 69.0 in | Wt 226.4 lb

## 2017-10-01 DIAGNOSIS — Z79899 Other long term (current) drug therapy: Secondary | ICD-10-CM | POA: Insufficient documentation

## 2017-10-01 DIAGNOSIS — Z9889 Other specified postprocedural states: Secondary | ICD-10-CM | POA: Insufficient documentation

## 2017-10-01 DIAGNOSIS — F17200 Nicotine dependence, unspecified, uncomplicated: Secondary | ICD-10-CM | POA: Insufficient documentation

## 2017-10-01 DIAGNOSIS — G40802 Other epilepsy, not intractable, without status epilepticus: Secondary | ICD-10-CM

## 2017-10-01 DIAGNOSIS — Z791 Long term (current) use of non-steroidal anti-inflammatories (NSAID): Secondary | ICD-10-CM | POA: Insufficient documentation

## 2017-10-01 DIAGNOSIS — G40909 Epilepsy, unspecified, not intractable, without status epilepticus: Secondary | ICD-10-CM | POA: Insufficient documentation

## 2017-10-01 MED ORDER — DIVALPROEX SODIUM ER 500 MG PO TB24: 2000.0000 mg | ORAL_TABLET | Freq: Every day | ORAL | 3 refills | Status: DC

## 2017-10-01 MED ORDER — DIVALPROEX SODIUM ER 500 MG PO TB24: 2000.0000 mg | ORAL_TABLET | Freq: Every day | ORAL | 6 refills | Status: DC

## 2017-10-01 NOTE — Progress Notes (Signed)
Assessment & Plan:  Donald Travis was seen today for establish care.  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 -     Ambulatory referral to Neurology -     divalproex (DEPAKOTE ER) 500 MG 24 hr tablet; Take 4 tablets (2,000 mg total) by mouth daily.  Current smoker Donald Travis was counseled on the dangers of tobacco use, and was advised to quit. Reviewed strategies to maximize success, including removing cigarettes and smoking materials from environment, stress management and support of family/friends as well as pharmacological alternatives including: Wellbutrin, Chantix, Nicotine patch, Nicotine gum or lozenges. Smoking cessation support: smoking cessation hotline: 1-800-QUIT-NOW.  Smoking cessation classes are also available through [facility]. Call [phone] or visit our website at [email or web address].   Spent 5 minutes counseling on smoking cessation and patient is not ready to quit.   Patient has been counseled on age-appropriate routine health concerns for screening and prevention. These are reviewed and up-to-date. Referrals have been placed accordingly. Immunizations are up-to-date or declined.    Subjective:   Chief Complaint  Patient presents with  . Establish Care    Patient is here to establish care and refills medication.    HPI Donald Travis 31 y.o. male presents to office today to establish care and to request medication refills .   Epilepsy Seizure Disorder: Episodes first started at age 89.  He has had no seizure activity in over 7 years. He endorses no seizure activity since 2011 however there is an ED report from 01-26-2016 in which patient was seen in the ED after a witnessed seizure. His seizure prior to that episode was reportedly in 2016. Seizures appear to be precipitated by emotional stress and sleep deprivation. Symptoms associated with seizures are strange sensations. Patient  does not have a history of head trauma.  He does not have a history of CVA. He does not have a history of alcohol abuse. He does not have a history of drug abuse. He does not have a history of developmental delay. He does not have a family history of seizure disorder. Work up thus far has been: neurology consultation. Treatment thus far has been: valproic acid (Depakote), with significant results.   Review of Systems  Constitutional: Negative.  Negative for chills, fever, malaise/fatigue and weight loss.  HENT: Negative.  Negative for congestion, ear pain, nosebleeds, sore throat and tinnitus.   Eyes: Negative.   Respiratory: Negative.   Cardiovascular: Negative for chest pain, palpitations, orthopnea and PND.  Gastrointestinal: Negative for abdominal pain.  Musculoskeletal:       Chronic knee pain  Skin: Negative for itching and rash.  Neurological: Negative.  Negative for dizziness, tingling, tremors, sensory change, speech change, focal weakness, seizures, loss of consciousness and headaches.  Psychiatric/Behavioral: Negative.     Past Medical History:  Diagnosis Date  . Asthma    in childhood, no asthma problems since age 31   . Epilepsy (HCC) 1993  . Seizure [facility](HCC)     Past Surgical History:  Procedure Laterality Date  . TONSILLECTOMY  ?    as a childhood     Family History  Adopted: Yes  Family history unknown: Yes    Social History Reviewed with no changes to be made today.   Outpatient Medications Prior to Visit  Medication Sig Dispense Refill  . ibuprofen (ADVIL,MOTRIN) 600 MG tablet Take 1 tablet (600 mg total) by mouth every 6 (six) hours as  needed. 30 tablet 0  . divalproex (DEPAKOTE ER) 500 MG 24 hr tablet Take 4 tablets (2,000 mg total) daily by mouth. 120 tablet 0  . ondansetron (ZOFRAN) 4 MG tablet Take 1 tablet (4 mg total) by mouth every 6 (six) hours. (Patient not taking: Reported on 10/01/2017) 12 tablet 0   No facility-administered medications prior to  visit.     No Known Allergies     Objective:    BP 138/86 (BP Location: Right Arm, Patient Position: Sitting, Cuff Size: Normal)   Pulse 86   Temp 98.5 F (36.9 C) (Oral)   Resp 16   Ht 5\' 9"  (1.753 m)   Wt 226 lb 6.4 oz (102.7 kg)   SpO2 91%   BMI 33.43 kg/m  Wt Readings from Last 3 Encounters:  10/01/17 226 lb 6.4 oz (102.7 kg)  02/02/17 220 lb (99.8 kg)  01/09/17 204 lb (92.5 kg)    Physical Exam  Constitutional: He is oriented to person, place, and time. He appears well-developed and well-nourished. He is cooperative.  HENT:  Head: Normocephalic and atraumatic.  Eyes: EOM are normal.  Neck: Normal range of motion.  Cardiovascular: Normal rate, regular rhythm, normal heart sounds and intact distal pulses. Exam reveals no gallop and no friction rub.  No murmur heard. Pulmonary/Chest: Effort normal and breath sounds normal. No tachypnea. No respiratory distress. He has no decreased breath sounds. He has no wheezes. He has no rhonchi. He has no rales. He exhibits no tenderness.  Abdominal: Soft. Bowel sounds are normal.  Musculoskeletal: Normal range of motion. He exhibits no edema.  Neurological: He is alert and oriented to person, place, and time. He has normal strength. No cranial nerve deficit or sensory deficit. Coordination and gait normal.  Skin: Skin is warm and dry.  Psychiatric: He has a normal mood and affect. His behavior is normal. Judgment and thought content normal.  Nursing note and vitals reviewed.        Patient has been counseled extensively about nutrition and exercise as well as the importance of adherence with medications and regular follow-up. The patient was given clear instructions to go to ER or return to medical center if symptoms don't improve, worsen or new problems develop. The patient verbalized understanding.   Follow-up: Return in about 2 months (around 12/01/2017) for Needs appointment with financial representative.Claiborne Rigg.   Daiel Strohecker W Harshaan Whang,  FNP-BC [facility] [place], [place] [phone]   10/01/2017, 5:17 PM

## 2017-10-05 ENCOUNTER — Encounter: Payer: Self-pay | Admitting: Neurology

## 2017-10-10 ENCOUNTER — Ambulatory Visit: Payer: Self-pay | Attending: Nurse Practitioner

## 2017-10-22 MED FILL — DIVALPROEX SOD ER 500 MG TA: 500 | 30 days supply | Qty: 120 | Fill #0

## 2017-11-16 MED FILL — DIVALPROEX SOD ER 500 MG TA: 500 | 30 days supply | Qty: 120 | Fill #1

## 2017-12-03 ENCOUNTER — Encounter: Payer: Self-pay | Admitting: Nurse Practitioner

## 2017-12-03 ENCOUNTER — Ambulatory Visit: Payer: Self-pay | Attending: Nurse Practitioner | Admitting: Nurse Practitioner

## 2017-12-03 VITALS — BP 125/79 | HR 78 | Temp 98.0°F | Ht 69.0 in | Wt 222.6 lb

## 2017-12-03 DIAGNOSIS — G40802 Other epilepsy, not intractable, without status epilepticus: Secondary | ICD-10-CM

## 2017-12-03 DIAGNOSIS — F17200 Nicotine dependence, unspecified, uncomplicated: Secondary | ICD-10-CM | POA: Insufficient documentation

## 2017-12-03 DIAGNOSIS — Z79899 Other long term (current) drug therapy: Secondary | ICD-10-CM | POA: Insufficient documentation

## 2017-12-03 DIAGNOSIS — Z9889 Other specified postprocedural states: Secondary | ICD-10-CM | POA: Insufficient documentation

## 2017-12-03 DIAGNOSIS — J45909 Unspecified asthma, uncomplicated: Secondary | ICD-10-CM | POA: Insufficient documentation

## 2017-12-03 DIAGNOSIS — K0889 Other specified disorders of teeth and supporting structures: Secondary | ICD-10-CM | POA: Insufficient documentation

## 2017-12-03 DIAGNOSIS — Z791 Long term (current) use of non-steroidal anti-inflammatories (NSAID): Secondary | ICD-10-CM | POA: Insufficient documentation

## 2017-12-03 DIAGNOSIS — G40909 Epilepsy, unspecified, not intractable, without status epilepticus: Secondary | ICD-10-CM | POA: Insufficient documentation

## 2017-12-03 NOTE — Patient Instructions (Addendum)
Coping with Quitting Smoking Quitting smoking is a physical and mental challenge. You will face cravings, withdrawal symptoms, and temptation. Before quitting, work with your health care provider to make a plan that can help you cope. Preparation can help you quit and keep you from giving in. How can I cope with cravings? Cravings usually last for 5-10 minutes. If you get through it, the craving will pass. Consider taking the following actions to help you cope with cravings:  Keep your mouth busy: ? Chew sugar-free gum. ? Suck on hard candies or a straw. ? Brush your teeth.  Keep your hands and body busy: ? Immediately change to a different activity when you feel a craving. ? Squeeze or play with a ball. ? Do an activity or a hobby, like making bead jewelry, practicing needlepoint, or working with wood. ? Mix up your normal routine. ? Take a short exercise break. Go for a quick walk or run up and down stairs. ? Spend time in public places where smoking is not allowed.  Focus on doing something kind or helpful for someone else.  Call a friend or family member to talk during a craving.  Join a support group.  Call a quit line, such as 1-800-QUIT-NOW.  Talk with your health care provider about medicines that might help you cope with cravings and make quitting easier for you.  How can I deal with withdrawal symptoms? Your body may experience negative effects as it tries to get used to not having nicotine in the system. These effects are called withdrawal symptoms. They may include:  Feeling hungrier than normal.  Trouble concentrating.  Irritability.  Trouble sleeping.  Feeling depressed.  Restlessness and agitation.  Craving a cigarette.  To manage withdrawal symptoms:  Avoid places, people, and activities that trigger your cravings.  Remember why you want to quit.  Get plenty of sleep.  Avoid coffee and other caffeinated drinks. These may worsen some of your  symptoms.  How can I handle social situations? Social situations can be difficult when you are quitting smoking, especially in the first few weeks. To manage this, you can:  Avoid parties, bars, and other social situations where people might be smoking.  Avoid alcohol.  Leave right away if you have the urge to smoke.  Explain to your family and friends that you are quitting smoking. Ask for understanding and support.  Plan activities with friends or family where smoking is not an option.  What are some ways I can cope with stress? Wanting to smoke may cause stress, and stress can make you want to smoke. Find ways to manage your stress. Relaxation techniques can help. For example:  Breathe slowly and deeply, in through your nose and out through your mouth.  Listen to soothing, relaxing music.  Talk with a family member or friend about your stress.  Light a candle.  Soak in a bath or take a shower.  Think about a peaceful place.  What are some ways I can prevent weight gain? Be aware that many people gain weight after they quit smoking. However, not everyone does. To keep from gaining weight, have a plan in place before you quit and stick to the plan after you quit. Your plan should include:  Having healthy snacks. When you have a craving, it may help to: ? Eat plain popcorn, crunchy carrots, celery, or other cut vegetables. ? Chew sugar-free gum.  Changing how you eat: ? Eat small portion sizes at meals. ?   Eat 4-6 small meals throughout the day instead of 1-2 large meals a day. ? Be mindful when you eat. Do not watch television or do other things that might distract you as you eat.  Exercising regularly: ? Make time to exercise each day. If you do not have time for a long workout, do short bouts of exercise for 5-10 minutes several times a day. ? Do some form of strengthening exercise, like weight lifting, and some form of aerobic exercise, like running or  swimming.  Drinking plenty of water or other low-calorie or no-calorie drinks. Drink 6-8 glasses of water daily, or as much as instructed by your health care provider.  Summary  Quitting smoking is a physical and mental challenge. You will face cravings, withdrawal symptoms, and temptation to smoke again. Preparation can help you as you go through these challenges.  You can cope with cravings by keeping your mouth busy (such as by chewing gum), keeping your body and hands busy, and making calls to family, friends, or a helpline for people who want to quit smoking.  You can cope with withdrawal symptoms by avoiding places where people smoke, avoiding drinks with caffeine, and getting plenty of rest.  Ask your health care provider about the different ways to prevent weight gain, avoid stress, and handle social situations. This information is not intended to replace advice given to you by your health care provider. Make sure you discuss any questions you have with your health care provider. Document Released: 10/20/2016 Document Revised: 10/20/2016 Document Reviewed: 10/20/2016 Elsevier Interactive Patient Education  2018 Elsevier Inc.  Steps to Quit Smoking Smoking tobacco can be bad for your health. It can also affect almost every organ in your body. Smoking puts you and people around you at risk for many serious long-lasting (chronic) diseases. Quitting smoking is hard, but it is one of the best things that you can do for your health. It is never too late to quit. What are the benefits of quitting smoking? When you quit smoking, you lower your risk for getting serious diseases and conditions. They can include:  Lung cancer or lung disease.  Heart disease.  Stroke.  Heart attack.  Not being able to have children (infertility).  Weak bones (osteoporosis) and broken bones (fractures).  If you have coughing, wheezing, and shortness of breath, those symptoms may get better when you  quit. You may also get sick less often. If you are pregnant, quitting smoking can help to lower your chances of having a baby of low birth weight. What can I do to help me quit smoking? Talk with your doctor about what can help you quit smoking. Some things you can do (strategies) include:  Quitting smoking totally, instead of slowly cutting back how much you smoke over a period of time.  Going to in-person counseling. You are more likely to quit if you go to many counseling sessions.  Using resources and support systems, such as: ? Online chats with a counselor. ? Phone quitlines. ? Printed self-help materials. ? Support groups or group counseling. ? Text messaging programs. ? Mobile phone apps or applications.  Taking medicines. Some of these medicines may have nicotine in them. If you are pregnant or breastfeeding, do not take any medicines to quit smoking unless your doctor says it is okay. Talk with your doctor about counseling or other things that can help you.  Talk with your doctor about using more than one strategy at the same time, such as   taking medicines while you are also going to in-person counseling. This can help make quitting easier. What things can I do to make it easier to quit? Quitting smoking might feel very hard at first, but there is a lot that you can do to make it easier. Take these steps:  Talk to your family and friends. Ask them to support and encourage you.  Call phone quitlines, reach out to support groups, or work with a Veterinary surgeon.  Ask people who smoke to not smoke around you.  Avoid places that make you want (trigger) to smoke, such as: ? Bars. ? Parties. ? Smoke-break areas at work.  Spend time with people who do not smoke.  Lower the stress in your life. Stress can make you want to smoke. Try these things to help your stress: ? Getting regular exercise. ? Deep-breathing exercises. ? Yoga. ? Meditating. ? Doing a body scan. To do this, close  your eyes, focus on one area of your body at a time from head to toe, and notice which parts of your body are tense. Try to relax the muscles in those areas.  Download or buy apps on your mobile phone or tablet that can help you stick to your quit plan. There are many free apps, such as QuitGuide from the [employer] developer for Disease Control and Prevention). You can find more support from smokefree.gov and other websites.  This information is not intended to replace advice given to you by your health care provider. Make sure you discuss any questions you have with your health care provider. Document Released: 08/19/2009 Document Revised: 06/20/2016 Document Reviewed: 03/09/2015 Elsevier Interactive Patient Education  2018 [employer].  Dental Pain Dental pain may be caused by many things, including:  Tooth decay (cavities or caries). Cavities expose the nerve of your tooth to air and hot or cold temperatures. This can cause pain or discomfort.  Abscess or infection. A dental abscess is a collection of infected pus from a bacterial infection in the inner part of the tooth (pulp). It usually occurs at the end of the tooth's root.  Injury.  An unknown reason (idiopathic).  Your pain may be mild or severe. It may only occur when:  You are chewing.  You are exposed to hot or cold temperature.  You are eating or drinking sugary foods or beverages, such as soda or candy.  Your pain may also be constant. Follow these instructions at home: Watch your dental pain for any changes. The following actions may help to lessen any discomfort that you are feeling:  Take medicines only as directed by your dentist.  If you were prescribed an antibiotic medicine, finish all of it even if you start to feel better.  Keep all follow-up visits as directed by your dentist. This is important.  Do not apply heat to the outside of your face.  Rinse your mouth or gargle with salt water if directed by your  dentist. This helps with pain and swelling. ? You can make salt water by adding  tsp of salt to 1 cup of warm water.  Apply ice to the painful area of your face: ? Put ice in a plastic bag. ? Place a towel between your skin and the bag. ? Leave the ice on for 20 minutes, 2-3 times per day.  Avoid foods or drinks that cause you pain, such as: ? Very hot or very cold foods or drinks. ? Sweet or sugary foods or drinks.  Contact  a health care provider if:  Your pain is not controlled with medicines.  Your symptoms are worse.  You have new symptoms. Get help right away if:  You are unable to open your mouth.  You are having trouble breathing or swallowing.  You have a fever.  Your face, neck, or jaw is swollen. This information is not intended to replace advice given to you by your health care provider. Make sure you discuss any questions you have with your health care provider. Document Released: 10/23/2005 Document Revised: 03/02/2016 Document Reviewed: 10/19/2014 Elsevier Interactive Patient Education  [employer]2018 Elsevier Inc.

## 2017-12-03 NOTE — Progress Notes (Signed)
Assessment & Plan:  Donald Travis was seen today for follow-up.  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 Reviewed strategies to maximize success, including removing cigarettes and smoking materials from environment, stress management and support of family/friends as well as pharmacological alternatives including: Wellbutrin, Chantix, Nicotine patch, Nicotine gum or lozenges. Smoking cessation support: smoking cessation hotline: 1-800-QUIT-NOW.  Smoking cessation classes are also available through [facility] and [facility]. Call [phone] or visit our website at [email or web address].   Spent  minutes counseling on smoking cessation and patient is not ready to quit.  Patient has been counseled on age-appropriate routine health concerns for screening and prevention. These are reviewed and up-to-date. Referrals have been placed accordingly. Immunizations are up-to-date or declined.    Subjective:   Chief Complaint  Patient presents with  . Follow-up    Patient is here for a follow-up.   HPI Donald Travis 32 y.o. male presents to office today for follow up.  Epilepsy Chronic. Stable. Endorses medication compliance. Taking depakote 2000mg  daily. He denies any recent seizure activity since his last office visit 10-01-2017. Last witnessed seizure 01-26-2016. He is awaiting his Neurology appointment.  Lab Results  Component Value Date   VALPROATE 117 (H) 12/03/2017     Dental Pain Onset 3 months ago after a part of one of his molars became broken off. He also has several cavities that need to be filled. He denies fevers, purulent drainage or abscess.   Review of Systems  Constitutional: Negative.  Negative for chills,  fever, malaise/fatigue and weight loss.  HENT: Negative for ear pain.   Eyes: Negative.  Negative for double vision and pain.  Respiratory: Negative.  Negative for cough, sputum production, shortness of breath and wheezing.   Cardiovascular: Negative.  Negative for chest pain, palpitations, orthopnea and leg swelling.  Gastrointestinal: Negative for nausea and vomiting.  Neurological: Positive for seizures (stable). Negative for dizziness, sensory change, speech change and headaches.    Past Medical History:  Diagnosis Date  . Asthma    in childhood, no asthma problems since age 87   . Epilepsy (HCC) 1993  . Seizure [facility])     Past Surgical History:  Procedure Laterality Date  . TONSILLECTOMY  ?    as a childhood     Family History  Adopted: Yes  Family history unknown: Yes    Social History Reviewed with no changes to be made today.   Outpatient Medications Prior to Visit  Medication Sig Dispense Refill  . divalproex (DEPAKOTE ER) 500 MG 24 hr tablet Take 4 tablets (2,000 mg total) by mouth daily. 120 tablet 6  . ibuprofen (ADVIL,MOTRIN) 600 MG tablet Take 1 tablet (600 mg total) by mouth every 6 (six) hours as needed. 30 tablet 0   No facility-administered medications prior to visit.     No Known Allergies     Objective:    BP 125/79 (BP Location: Left Arm, Patient Position: Sitting, Cuff Size: Normal)   Pulse 78   Temp 98 F (36.7 C) (Oral)   Ht 5\' 9"  (1.753 m)   Wt 222 lb 9.6 oz (101 kg)   SpO2 98%   BMI 32.87 kg/m  Wt Readings from Last 3 Encounters:  12/03/17 222 lb 9.6 oz (  101 kg)  10/01/17 226 lb 6.4 oz (102.7 kg)  02/02/17 220 lb (99.8 kg)    Physical Exam  Constitutional: He is oriented to person, place, and time. He appears well-developed and well-nourished. He is cooperative.  HENT:  Head: Normocephalic and atraumatic.  Mouth/Throat: Oropharynx is clear and moist and mucous membranes are normal. Oral lesions (torus palatinus) present. Abnormal  dentition. Dental caries present. No dental abscesses, uvula swelling or lacerations.  Eyes: EOM are normal.  Neck: Normal range of motion. No thyromegaly present.  Cardiovascular: Normal rate, regular rhythm and normal heart sounds.  Pulmonary/Chest: Effort normal and breath sounds normal. No tachypnea. He has no decreased breath sounds. He has no rhonchi.  Abdominal: Soft. Bowel sounds are normal.  Lymphadenopathy:    He has no cervical adenopathy.  Neurological: He is alert and oriented to person, place, and time. Coordination normal.  Skin: Skin is warm and dry.  Psychiatric: He has a normal mood and affect. His behavior is normal. Judgment and thought content normal.  Nursing note and vitals reviewed.        Patient has been counseled extensively about nutrition and exercise as well as the importance of adherence with medications and regular follow-up. The patient was given clear instructions to go to ER or return to medical center if symptoms don't improve, worsen or new problems develop. The patient verbalized understanding.   Follow-up: Return in about 6 months (around 06/02/2018) for FASTING labs and Physical.   Claiborne RiggZelda W Annjeanette Sarwar, FNP-BC [facility] [place] [phone]   12/05/2017, 9:49 PM

## 2017-12-04 LAB — VALPROIC ACID LEVEL: Valproic Acid Lvl: 117 ug/mL — ABNORMAL HIGH (ref 50–100)

## 2017-12-05 ENCOUNTER — Telehealth: Payer: Self-pay

## 2017-12-05 ENCOUNTER — Encounter: Payer: Self-pay | Admitting: Nurse Practitioner

## 2017-12-05 NOTE — Telephone Encounter (Signed)
-----   Message from Claiborne RiggZelda W Fleming, NP sent at 12/05/2017  8:56 AM EST ----- Valproic acid level is slightly elevated. Will recheck your levels in 4 weeks. Please make a lab appointment. Thank you.

## 2017-12-05 NOTE — Telephone Encounter (Signed)
CMA called patient regarding lab result.  Patient understood and will schedule a lab appointment in 4 weeks.

## 2017-12-19 MED FILL — ?DIVALPROEX SOD ER 500 MG T: 500 | 30 days supply | Qty: 120 | Fill #2

## 2017-12-24 ENCOUNTER — Ambulatory Visit: Payer: Self-pay | Admitting: Neurology

## 2018-01-17 MED FILL — DIVALPROEX SOD ER 500 MG TA: 500 | 30 days supply | Qty: 120 | Fill #3

## 2018-02-26 MED FILL — DIVALPROEX SOD ER 500 MG TA: 500 | 30 days supply | Qty: 120 | Fill #0

## 2018-03-11 ENCOUNTER — Encounter: Payer: Self-pay | Admitting: Neurology

## 2018-03-11 ENCOUNTER — Ambulatory Visit (INDEPENDENT_AMBULATORY_CARE_PROVIDER_SITE_OTHER): Payer: Self-pay | Admitting: Neurology

## 2018-03-11 VITALS — BP 126/90 | HR 57 | Ht 69.25 in | Wt 217.0 lb

## 2018-03-11 DIAGNOSIS — G40309 Generalized idiopathic epilepsy and epileptic syndromes, not intractable, without status epilepticus: Secondary | ICD-10-CM

## 2018-03-11 MED ORDER — DIVALPROEX SODIUM ER 500 MG PO TB24: ORAL_TABLET | ORAL | 11 refills | Status: DC

## 2018-03-11 NOTE — Patient Instructions (Signed)
1. Schedule 1-hour sleep-deprived EEG 2. Continue Depakote ER  4 tablets every morning 3. Follow-up in 6 months, call for any changes  Seizure Precautions: 1. If medication has been prescribed for you to prevent seizures, take it exactly as directed.  Do not stop taking the medicine without talking to your doctor first, even if you have not had a seizure in a long time.   2. Avoid activities in which a seizure would cause danger to yourself or to others.  Don't operate dangerous machinery, swim alone, or climb in high or dangerous places, such as on ladders, roofs, or girders.  Do not drive unless your doctor says you may.  3. If you have any warning that you may have a seizure, lay down in a safe place where you can't hurt yourself.    4.  No driving for 6 months from last seizure, as per [facility].   Please refer to the following link on the Epilepsy Foundation of America's website for more information: http://www.epilepsyfoundation.org/answerplace/Social/driving/drivingu.cfm    5.  Maintain good sleep hygiene. Avoid alcohol.  6.  Contact your doctor if you have any problems that may be related to the medicine you are taking.  7.  Call 911 and bring the patient back to the ED if:        A.  The seizure lasts longer than 5 minutes.       B.  The patient doesn't awaken shortly after the seizure  C.  The patient has new problems such as difficulty seeing, speaking or moving  D.  The patient was injured during the seizure  E.  The patient has a temperature over 102 F (39C)  F.  The patient vomited and now is having trouble breathing

## 2018-03-11 NOTE — Progress Notes (Signed)
NEUROLOGY CONSULTATION NOTE  Erastus Bartolomei MRN: [id number] DOB: Dec 19, 1985  Referring provider: Bertram Denver, NP Primary care provider: Bertram Denver, NP  Reason for consult:  Establish care for seizures  Thank you for your kind referral of Leona Pressly for consultation of the above symptoms. Although his history is well known to you, please allow me to reiterate it for the purpose of our medical record. The patient was accompanied to the clinic by his fiancee who also provides collateral information. Records and images were personally reviewed where available.   HISTORY OF PRESENT ILLNESS: This is a pleasant 32 year old right-handed man with a history of seizures since the 3rd grade, presenting to establish care. He reports hitting his head on the gym floor in the 3rd grade, and seizures starting since then. He was mostly having generalized convulsions and recalls trying different medications until he was started on Depakote in high school with good response. He recalls taking Tegretol at some point, does not recall the other medications. Now he would mostly have a "little petit mal" twitch on the side of his mouth, "almost like a stutter." This is how he feels prior to a seizure, he would take his medication and feel fine, but if he has a headache after and does not go to sleep, this would proceed to a GTC. He and his fiancee report the last GTC was 2.5 years ago. After a seizure he would stare off. Otherwise his fiancee has not seen any other staring/unresponsive episodes. He denies any myoclonic jerks in his extremities. He denies any olfactory/gustatory hallucinations, rising epigastric sensation. He denies any side effects on Depakote ER  qAM. If he takes medication at night, he may have a headache. Otherwise he denies any headaches, dizziness, diplopia, focal numbness/tingling/weakness, bowel/bladder dysfunction. Over the past year, he and his fiancee have noted some short-term  memory issues. He would not recall where he put things down 5 minutes earlier or a conversation he had the day prior. He denies getting lost driving, no missed bills. He takes his medication regularly, he has found that missing doses in combination with poor sleep and eating habits can trigger seizures. Last seizure 2.5 years ago occurred in this setting.   Epilepsy Risk Factors:  He was adopted and unaware of family history of seizures. As far as he knows, he had a normal birth and early development.  There is no history of febrile convulsions, CNS infections such as meningitis/encephalitis, significant traumatic brain injury, neurosurgical procedures.  Laboratory Data: Lab Results  Component Value Date   VALPROATE 117 (H) 12/03/2017    PAST MEDICAL HISTORY: Past Medical History:  Diagnosis Date  . Asthma    in childhood, no asthma problems since age 32   . Epilepsy (HCC) 1993  . Seizure (HCC)     PAST SURGICAL HISTORY: Past Surgical History:  Procedure Laterality Date  . TONSILLECTOMY  ?    as a childhood     MEDICATIONS: Current Outpatient Medications on File Prior to Visit  Medication Sig Dispense Refill  . divalproex (DEPAKOTE ER) 500 MG 24 hr tablet Take 4 tablets (2,000 mg total) by mouth daily. 120 tablet 6  . ibuprofen (ADVIL,MOTRIN) 600 MG tablet Take 1 tablet (600 mg total) by mouth every 6 (six) hours as needed. 30 tablet 0   No current facility-administered medications on file prior to visit.     ALLERGIES: Allergies  Allergen Reactions  . Tegretol [Carbamazepine]     Excessive weight gain  FAMILY HISTORY: Family History  Adopted: Yes  Family history unknown: Yes    SOCIAL HISTORY: Social History   Socioeconomic History  . Marital status: Single    Spouse name: Not on file  . Number of children: 1   . Years of education: some colle  . Highest education level: Not on file  Occupational History    Employer: [employer]  Social Needs  . Financial  resource strain: Not on file  . Food insecurity:    Worry: Not on file    Inability: Not on file  . Transportation needs:    Medical: Not on file    Non-medical: Not on file  Tobacco Use  . Smoking status: Current Some Day Smoker    Packs/day: 0.25    Years: 5.00    Pack years: 1.25    Types: Cigarettes  . Smokeless tobacco: Never Used  Substance and Sexual Activity  . Alcohol use: Yes    Alcohol/week: 0.0 oz    Comment: socially, evevey couple of weekes   . Drug use: No  . Sexual activity: Yes    Birth control/protection: None  Lifestyle  . Physical activity:    Days per week: Not on file    Minutes per session: Not on file  . Stress: Not on file  Relationships  . Social connections:    Talks on phone: Not on file    Gets together: Not on file    Attends religious service: Not on file    Active member of club or organization: Not on file    Attends meetings of clubs or organizations: Not on file    Relationship status: Not on file  . Intimate partner violence:    Fear of current or ex partner: Not on file    Emotionally abused: Not on file    Physically abused: Not on file    Forced sexual activity: Not on file  Other Topics Concern  . Not on file  Social History Narrative    Pt lives in 2 story home with girlfriend and roommate   Has 1 daughter   Some college education   Works as a DJ    REVIEW OF SYSTEMS: Constitutional: No fevers, chills, or sweats, no generalized fatigue, change in appetite Eyes: No visual changes, double vision, eye pain Ear, nose and throat: No hearing loss, ear pain, nasal congestion, sore throat Cardiovascular: No chest pain, palpitations Respiratory:  No shortness of breath at rest or with exertion, wheezes GastrointestinaI: No nausea, vomiting, diarrhea, abdominal pain, fecal incontinence Genitourinary:  No dysuria, urinary retention or frequency Musculoskeletal:  No neck pain, back pain Integumentary: No rash, pruritus, skin  lesions Neurological: as above Psychiatric: No depression, insomnia, anxiety Endocrine: No palpitations, fatigue, diaphoresis, mood swings, change in appetite, change in weight, increased thirst Hematologic/Lymphatic:  No anemia, purpura, petechiae. Allergic/Immunologic: no itchy/runny eyes, nasal congestion, recent allergic reactions, rashes  PHYSICAL EXAM: Vitals:   03/11/18 1030  BP: 126/90  Pulse: (!) 57  SpO2: 97%   General: No acute distress Head:  Normocephalic/atraumatic Eyes: Fundoscopic exam shows bilateral sharp discs, no vessel changes, exudates, or hemorrhages Neck: supple, no paraspinal tenderness, full range of motion Back: No paraspinal tenderness Heart: regular rate and rhythm Lungs: Clear to auscultation bilaterally. Vascular: No carotid bruits. Skin/Extremities: No rash, no edema Neurological Exam: Mental status: alert and oriented to person, place, and time, no dysarthria or aphasia, Fund of knowledge is appropriate.  Recent and remote memory are intact. 3/3 delayed  recall. Attention and concentration are normal.    Able to name objects and repeat phrases. Cranial nerves: CN I: not tested CN II: pupils equal, round and reactive to light, visual fields intact, fundi unremarkable. CN III, IV, VI:  full range of motion, no nystagmus, no ptosis CN V: facial sensation intact CN VII: upper and lower face symmetric CN VIII: hearing intact to finger rub CN IX, X: gag intact, uvula midline CN XI: sternocleidomastoid and trapezius muscles intact CN XII: tongue midline Bulk & Tone: normal, no fasciculations. Motor: 5/5 throughout with no pronator drift. Sensation: intact to light touch, cold, pin, vibration and joint position sense.  No extinction to double simultaneous stimulation.  Romberg test negative Deep Tendon Reflexes: +2 throughout, no ankle clonus Plantar responses: downgoing bilaterally Cerebellar: no incoordination on finger to nose, heel to shin. No  dysdiadochokinesia Gait: narrow-based and steady, able to tandem walk adequately. 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 He reports twitching on the side of his mouth/"almost a stutter" and GTCs. He has not had any GTCs in 2.5 years, taking Depakote ER  4 tabs every morning. His neurological exam is normal. He reports memory issues. A 1-hour sleep-deprived EEG will be ordered to classify his seizures and assess for subclinical seizures potentially causing memory changes. We discussed the mildly elevated Depakote level, unclear if this was a trough level, but would monitor him clinically, if no seizures and no side effects, would continue on same Depakote dose. [place] driving laws were discussed with the patient, and he knows to stop driving after a seizure, until 6 months seizure-free. He will follow-up in 6 months and knows to call for any changes.   Thank you for allowing me to participate in the care of this patient. Please do not hesitate to call for any questions or concerns.   Patrcia Dolly, M.D.  CC: Bertram Denver, NP

## 2018-03-14 ENCOUNTER — Ambulatory Visit (INDEPENDENT_AMBULATORY_CARE_PROVIDER_SITE_OTHER): Payer: Self-pay | Admitting: Neurology

## 2018-03-14 DIAGNOSIS — G40309 Generalized idiopathic epilepsy and epileptic syndromes, not intractable, without status epilepticus: Secondary | ICD-10-CM

## 2018-03-14 NOTE — Procedures (Signed)
ELECTROENCEPHALOGRAM REPORT  Date of Study: 03/14/2018  Patient's Name: Donald Travis MRN: [id number] Date of Birth: Apr 03, 1986  Referring Provider: Dr. Patrcia Dolly  Clinical History: This is a 32 year old man with recurrent seizures with convulsions preceded by twitching on side of mouth. EEG for classification  Medications: Depakote  Technical Summary: A multichannel digital 1-hour sleep-deprived EEG recording measured by the international 10-20 system with electrodes applied with paste and impedances below 5000 ohms performed in our laboratory with EKG monitoring in an awake and asleep patient.  Hyperventilation and photic stimulation were performed.  The digital EEG was referentially recorded, reformatted, and digitally filtered in a variety of bipolar and referential montages for optimal display.    Description: The patient is awake and asleep during the recording.  During maximal wakefulness, there is a symmetric, medium voltage 9 Hz posterior dominant rhythm that attenuates with eye opening.  The record is symmetric.  During drowsiness and sleep, there is an increase in theta slowing of the background.  Vertex waves and symmetric sleep spindles were seen.  Hyperventilation and photic stimulation did not elicit any abnormalities. During drowsiness and sleep, there were frequent generalized high voltage irregular 4-5 Hz spike and polyspike and wave discharges with frontal predominance seen, at times with right-sided lead in. There were occasional fragments thereof seen in the right frontal region. There were no electrographic seizures seen.    EKG lead was unremarkable.  Impression: This 1-hour awake and asleep EEG is abnormal due to the presence of generalized 4-5 Hz spike and polyspike and wave discharges with frontal predominance seen exclusively in sleep, at times with right-sided lead-in.  Clinical Correlation of the above findings is consistent with a primary generalized  epilepsy. There were occasional fragments of discharges seen over the right frontal region and right-sided lead-in seen at times, that also raises the possibility of a focal epilepsy with secondary bisynchrony. Clinical correlation is advised.   Patrcia Dolly, M.D.

## 2018-03-26 MED FILL — DIVALPROEX SOD ER 500 MG TA: 500 | 30 days supply | Qty: 120 | Fill #1

## 2018-04-21 ENCOUNTER — Other Ambulatory Visit: Payer: Self-pay

## 2018-04-21 ENCOUNTER — Encounter (HOSPITAL_COMMUNITY): Payer: Self-pay | Admitting: Emergency Medicine

## 2018-04-21 ENCOUNTER — Emergency Department (HOSPITAL_COMMUNITY)
Admission: EM | Admit: 2018-04-21 | Discharge: 2018-04-21 | Disposition: A | Discharge status: Home / Self Care | Payer: Self-pay | Attending: Emergency Medicine | Admitting: Emergency Medicine

## 2018-04-21 DIAGNOSIS — J45909 Unspecified asthma, uncomplicated: Secondary | ICD-10-CM | POA: Insufficient documentation

## 2018-04-21 DIAGNOSIS — R0989 Other specified symptoms and signs involving the circulatory and respiratory systems: Secondary | ICD-10-CM | POA: Insufficient documentation

## 2018-04-21 DIAGNOSIS — F17210 Nicotine dependence, cigarettes, uncomplicated: Secondary | ICD-10-CM | POA: Insufficient documentation

## 2018-04-21 NOTE — ED Provider Notes (Signed)
[facility]-EMERGENCY DEPT Provider Note   CSN: [id number] Arrival date & time: 04/21/18  0240     History   Chief Complaint Chief Complaint  Patient presents with  . sunflower seed stuck in throat    HPI Donald Travis is a 32 y.o. male.  The history is provided by the patient and medical records.    32 year old male with history of asthma, epilepsy, presenting to the ED with complaint of foreign body stuck in throat. States he was eating sunflower seeds earlier this evening and accidentally swallowed a piece of the shell. States he felt like it was stuck in the lower portion of his throat. He tried drinking water but continued feeling like it was stuck, but was moving up and down in his throat. Since arriving in the ED, states it feels like it has passed now. He has been able in full bottle of water without issue. States throat does feel somewhat irritated but no significant pain. He has no history of esophageal strictures or narrowing in the past.  Past Medical History:  Diagnosis Date  . Asthma    in childhood, no asthma problems since age 34   . Epilepsy (HCC) 1993  . Seizure [facility])     Patient Active Problem List   Diagnosis Date Noted  . Chronic patellofemoral pain of right knee 04/27/2016  . Current smoker 12/03/2014  . Generalized idiopathic epilepsy and epileptic syndromes, not intractable, without status epilepticus [facility])     Past Surgical History:  Procedure Laterality Date  . TONSILLECTOMY  ?    as a childhood         Home Medications    Prior to Admission medications   Medication Sig Start Date End Date Taking? Authorizing Provider  divalproex (DEPAKOTE ER) 500 MG 24 hr tablet Take 4 tablets daily 03/11/18   Van Clines, MD  ibuprofen (ADVIL,MOTRIN) 600 MG tablet Take 1 tablet (600 mg total) by mouth every 6 (six) hours as needed. 01/10/17   Fayrene Helper, PA-C    Family History Family History  Adopted: Yes  Family history  unknown: Yes    Social History Social History   Tobacco Use  . Smoking status: Current Some Day Smoker    Packs/day: 0.25    Years: 5.00    Pack years: 1.25    Types: Cigarettes  . Smokeless tobacco: Never Used  Substance Use Topics  . Alcohol use: Yes    Alcohol/week: 0.0 oz    Comment: socially, evevey couple of weekes   . Drug use: No     Allergies   Tegretol [carbamazepine]   Review of Systems Review of Systems  HENT: Positive for trouble swallowing.   All other systems reviewed and are negative.    Physical Exam Updated Vital Signs BP (!) 156/101 (BP Location: Right Arm)   Pulse 90   Temp 97.8 F (36.6 C) (Oral)   Resp 18   Ht 5\' 10"  (1.778 m)   Wt 98.4 kg (217 lb)   SpO2 99%   BMI 31.14 kg/m   Physical Exam  Constitutional: He is oriented to person, place, and time. He appears well-developed and well-nourished.  HENT:  Head: Normocephalic and atraumatic.  Mouth/Throat: Oropharynx is clear and moist.  Oropharynx clear, no distress, normal phonation without stridor, airway patent  Eyes: Pupils are equal, round, and reactive to light. Conjunctivae and EOM are normal.  Neck: Normal range of motion.  Cardiovascular: Normal rate, regular rhythm and normal  heart sounds.  Pulmonary/Chest: Effort normal and breath sounds normal.  Abdominal: Soft. Bowel sounds are normal.  Musculoskeletal: Normal range of motion.  Neurological: He is alert and oriented to person, place, and time.  Skin: Skin is warm and dry.  Psychiatric: He has a normal mood and affect.  Nursing note and vitals reviewed.    ED Treatments / Results  Labs (all labs ordered are listed, but only abnormal results are displayed) Labs Reviewed - No data to display  EKG None  Radiology No results found.  Procedures Procedures (including critical care time)  Medications Ordered in ED Medications - No data to display   Initial Impression / Assessment and Plan / ED Course  I have  reviewed the triage vital signs and the nursing notes.  Pertinent labs & imaging results that were available during my care of the patient were reviewed by me and considered in my medical decision making (see chart for details).  32 year old male here with complaint of foreign body secondary his throat. Was eating sunflower seeds earlier today and accident with swallow part of the Shell.  States since arrival in the ED he feels like it has passed. He has been able to drink full bottle of water here without issue. He has no airway obstruction on exam, normal phonation without stridor. Handling secretions well. At this time, no further intervention needed. Will discharge home. Can follow-up with PCP the ongoing issues.  Discussed plan with patient, he acknowledged understanding and agreed with plan of care.  Return precautions given for new or worsening symptoms.  Final Clinical Impressions(s) / ED Diagnoses   Final diagnoses:  Sensation of foreign body in throat    ED Discharge Orders    None       Garlon HatchetSanders, Laisha Rau M, PA-C 04/21/18 0610    Paula LibraMolpus, John, MD 04/21/18 [phone]

## 2018-04-21 NOTE — ED Notes (Signed)
States he now feels like see has moved all the way down and is no longer stuck in throat

## 2018-04-21 NOTE — Discharge Instructions (Signed)
You can follow-up with your primary care doctor if any ongoing issues. Return here for any new/acute changes.

## 2018-04-21 NOTE — ED Triage Notes (Signed)
Patient is complaining that he has a sunflower seed stuck in hit throat.

## 2018-05-03 MED FILL — DIVALPROEX SOD ER 500 MG TA: 500 | 30 days supply | Qty: 120 | Fill #2

## 2018-05-28 MED FILL — DIVALPROEX SOD ER 500 MG TA: 500 | 30 days supply | Qty: 120 | Fill #3

## 2018-06-28 ENCOUNTER — Emergency Department (HOSPITAL_COMMUNITY)
Admission: EM | Admit: 2018-06-28 | Discharge: 2018-06-28 | Disposition: A | Discharge status: Home / Self Care | Payer: Self-pay | Attending: Emergency Medicine | Admitting: Emergency Medicine

## 2018-06-28 ENCOUNTER — Encounter (HOSPITAL_COMMUNITY): Payer: Self-pay | Admitting: Emergency Medicine

## 2018-06-28 ENCOUNTER — Other Ambulatory Visit: Payer: Self-pay

## 2018-06-28 DIAGNOSIS — L0291 Cutaneous abscess, unspecified: Secondary | ICD-10-CM

## 2018-06-28 DIAGNOSIS — Z79899 Other long term (current) drug therapy: Secondary | ICD-10-CM | POA: Insufficient documentation

## 2018-06-28 DIAGNOSIS — F17210 Nicotine dependence, cigarettes, uncomplicated: Secondary | ICD-10-CM | POA: Insufficient documentation

## 2018-06-28 DIAGNOSIS — J45909 Unspecified asthma, uncomplicated: Secondary | ICD-10-CM | POA: Insufficient documentation

## 2018-06-28 DIAGNOSIS — L02412 Cutaneous abscess of left axilla: Secondary | ICD-10-CM | POA: Insufficient documentation

## 2018-06-28 MED ORDER — CLINDAMYCIN HCL 150 MG PO CAPS: 300.0000 mg | ORAL_CAPSULE | Freq: Three times a day (TID) | ORAL | 0 refills | Status: DC

## 2018-06-28 MED ORDER — CLINDAMYCIN HCL 300 MG PO CAPS
300.0000 mg | ORAL_CAPSULE | Freq: Once | ORAL | Status: AC
  Administered 2018-06-28: 300 mg via ORAL
  Filled 2018-06-28: qty 1

## 2018-06-28 NOTE — ED Triage Notes (Signed)
Pt states he had an abscess under his left arm for the past 3-4 days and it popped tonight and has been draining  Pt states since it popped his pain has increased and his left arm feels like it is going numb  Pt states the pain is worse with movement

## 2018-06-28 NOTE — Discharge Instructions (Signed)
Take the prescribed medication as directed.  Can do warm compresses at home. Follow-up with your primary care doctor. Return to the ED for new or worsening symptoms.

## 2018-06-28 NOTE — ED Provider Notes (Signed)
[facility]-EMERGENCY DEPT Provider Note   CSN: [id number] Arrival date & time: 06/28/18  0018     History   Chief Complaint Chief Complaint  Patient presents with  . Abscess    HPI Donald Travis is a 32 y.o. male.  The history is provided by the patient and medical records.  Abscess     32 year old male with history of asthma, epilepsy, seizures, presenting to the ED with pain in his left axilla.  Reports he has had an abscess developing in his armpit for the past 4 days, has been hard and very firm like a "marble".  States today it popped and he can feel stuff "draining" beneath his skin.  States area actually feels better since this happened but remains tender to the touch.  He denies any fevers or chills.  No known history of MRSA, diabetes, or HIV.  He has not tried any medications for his symptoms.  Past Medical History:  Diagnosis Date  . Asthma    in childhood, no asthma problems since age 32   . Epilepsy (HCC) 1993  . Seizure [facility](HCC)     Patient Active Problem List   Diagnosis Date Noted  . Chronic patellofemoral pain of right knee 04/27/2016  . Current smoker 12/03/2014  . Generalized idiopathic epilepsy and epileptic syndromes, not intractable, without status epilepticus [facility](HCC)     Past Surgical History:  Procedure Laterality Date  . TONSILLECTOMY  ?    as a childhood         Home Medications    Prior to Admission medications   Medication Sig Start Date End Date Taking? Authorizing Provider  divalproex (DEPAKOTE ER) 500 MG 24 hr tablet Take 4 tablets daily 03/11/18   Van ClinesAquino, Karen M, MD  ibuprofen (ADVIL,MOTRIN) 600 MG tablet Take 1 tablet (600 mg total) by mouth every 6 (six) hours as needed. 01/10/17   Fayrene Helperran, [place], PA-C    Family History Family History  Adopted: Yes  Family history unknown: Yes    Social History Social History   Tobacco Use  . Smoking status: Current Some Day Smoker    Packs/day: 0.25    Years: 5.00   Pack years: 1.25    Types: Cigarettes  . Smokeless tobacco: Never Used  Substance Use Topics  . Alcohol use: Yes    Alcohol/week: 0.0 standard drinks    Comment: socially, evevey couple of weekes   . Drug use: No     Allergies   Tegretol [carbamazepine]   Review of Systems Review of Systems  Skin:       abscess  All other systems reviewed and are negative.    Physical Exam Updated Vital Signs BP (!) 142/93 (BP Location: Right Arm)   Pulse 80   Temp 98.2 F (36.8 C) (Oral)   Resp 16   Ht 5\' 9"  (1.753 m)   Wt 97.5 kg   SpO2 98%   BMI 31.75 kg/m   Physical Exam  Constitutional: He is oriented to person, place, and time. He appears well-developed and well-nourished.  HENT:  Head: Normocephalic and atraumatic.  Mouth/Throat: Oropharynx is clear and moist.  Eyes: Pupils are equal, round, and reactive to light. Conjunctivae and EOM are normal.  Neck: Normal range of motion.  Cardiovascular: Normal rate, regular rhythm and normal heart sounds.  Pulmonary/Chest: Effort normal and breath sounds normal.  Abdominal: Soft. Bowel sounds are normal.  Musculoskeletal: Normal range of motion.  Neurological: He is alert and oriented to  person, place, and time.  Skin: Skin is warm and dry.  Boil of left axilla palpable beneath skin, moderate in size, feels hollowed out centrally without significant fluctuance; area is locally tender to palpation; no overlying erythema, induration, or evidence of superficial cellulitis  Psychiatric: He has a normal mood and affect.  Nursing note and vitals reviewed.    ED Treatments / Results  Labs (all labs ordered are listed, but only abnormal results are displayed) Labs Reviewed - No data to display  EKG None  Radiology No results found.  Procedures Procedures (including critical care time)  Medications Ordered in ED Medications  clindamycin (CLEOCIN) capsule 300 mg (300 mg Oral Given 06/28/18 0155)     Initial Impression /  Assessment and Plan / ED Course  I have reviewed the triage vital signs and the nursing notes.  Pertinent labs & imaging results that were available during my care of the patient were reviewed by me and considered in my medical decision making (see chart for details).  32 y.o. M here with apparent abscess of left axilla that he reports "busted beneath the skin" earlier today.  He is afebrile, non-toxic.  Does have palpable boil beneath the skin of left axilla, feels hollowed out centrally without remaining fluctuance.  No overlying skin changes or external drainage. Will start on abx.  Recommend warm compresses.  Close follow-up with PCP.  Discussed plan with patient, he acknowledged understanding and agreed with plan of care.  Return precautions given for new or worsening symptoms.  Final Clinical Impressions(s) / ED Diagnoses   Final diagnoses:  Abscess    ED Discharge Orders         Ordered     06/28/18 0215    clindamycin (CLEOCIN) 150 MG capsule  3 times daily     06/28/18 0215           Garlon Hatchet, PA-C 06/28/18 0238    Molpus, Jonny Ruiz, MD 06/28/18 [phone]

## 2018-07-05 MED FILL — DIVALPROEX SOD ER 500 MG TA: 500 | 30 days supply | Qty: 120 | Fill #4

## 2018-08-05 MED FILL — DIVALPROEX SOD ER 500 MG TA: 500 | 30 days supply | Qty: 120 | Fill #5

## 2018-09-05 MED FILL — DIVALPROEX SOD ER 500 MG TA: 500 | 30 days supply | Qty: 120 | Fill #6

## 2018-09-26 ENCOUNTER — Emergency Department (HOSPITAL_COMMUNITY): Payer: Self-pay

## 2018-09-26 ENCOUNTER — Encounter (HOSPITAL_COMMUNITY): Payer: Self-pay | Admitting: Emergency Medicine

## 2018-09-26 ENCOUNTER — Emergency Department (HOSPITAL_COMMUNITY)
Admission: EM | Admit: 2018-09-26 | Discharge: 2018-09-26 | Disposition: A | Discharge status: Home / Self Care | Payer: Self-pay | Attending: Emergency Medicine | Admitting: Emergency Medicine

## 2018-09-26 ENCOUNTER — Other Ambulatory Visit: Payer: Self-pay

## 2018-09-26 DIAGNOSIS — J45909 Unspecified asthma, uncomplicated: Secondary | ICD-10-CM | POA: Insufficient documentation

## 2018-09-26 DIAGNOSIS — R569 Unspecified convulsions: Secondary | ICD-10-CM

## 2018-09-26 DIAGNOSIS — Z79899 Other long term (current) drug therapy: Secondary | ICD-10-CM | POA: Insufficient documentation

## 2018-09-26 DIAGNOSIS — F17210 Nicotine dependence, cigarettes, uncomplicated: Secondary | ICD-10-CM | POA: Insufficient documentation

## 2018-09-26 LAB — I-STAT CHEM 8, ED
BUN: 12 mg/dL (ref 6–20)
CHLORIDE: 104 mmol/L (ref 98–111)
Calcium, Ion: 1.17 mmol/L (ref 1.15–1.40)
Creatinine, Ser: 0.9 mg/dL (ref 0.61–1.24)
Glucose, Bld: 131 mg/dL — ABNORMAL HIGH (ref 70–99)
HCT: 46 % (ref 39.0–52.0)
HEMOGLOBIN: 15.6 g/dL (ref 13.0–17.0)
Potassium: 4.1 mmol/L (ref 3.5–5.1)
SODIUM: 139 mmol/L (ref 135–145)
TCO2: 26 mmol/L (ref 22–32)

## 2018-09-26 LAB — VALPROIC ACID LEVEL: Valproic Acid Lvl: 74 ug/mL (ref 50.0–100.0)

## 2018-09-26 LAB — CBG MONITORING, ED: Glucose-Capillary: 120 mg/dL — ABNORMAL HIGH (ref 70–99)

## 2018-09-26 MED ORDER — OXYCODONE-ACETAMINOPHEN 5-325 MG PO TABS
1.0000 | ORAL_TABLET | Freq: Once | ORAL | Status: AC
  Administered 2018-09-26: 1 via ORAL
  Filled 2018-09-26: qty 1

## 2018-09-26 NOTE — ED Notes (Signed)
Bed: WA07 Expected date:  Expected time:  Means of arrival:  Comments: EMS "seizures" 

## 2018-09-26 NOTE — ED Provider Notes (Addendum)
[facility]-EMERGENCY DEPT Provider Note   CSN: [id number] Arrival date & time: 09/26/18  1604     History   Chief Complaint Chief Complaint  Patient presents with  . Seizures    HPI Donald NixonJairus Travis is a 32 y.o. male.  HPI  32 yo male history of seizures present today with sezure.  EMS reports coworkers witnessed a 3-5 minute grand mal seizure.  Patient fell back striking head.  No loc.  Patient with abrasion to back of head.  He is awake and alert now-although ems reprts he was postictal at scene.  He denies headache or neck pain or other injury.  He reports taking medication as prescribed. He has had seizures since 3rd grade.  He does not remember the last time he had a seizure.  He was last seen at his neurologist office 3 months ago and has a scheduled appointment tomorrow.  He denies precipitating factors such as sleep deprivation, alcohol or subbstance abuse, changes in meds.   Past Medical History:  Diagnosis Date  . Asthma    in childhood, no asthma problems since age 32   . Epilepsy (HCC) 1993  . Seizure [facility](HCC)     Patient Active Problem List   Diagnosis Date Noted  . Chronic patellofemoral pain of right knee 04/27/2016  . Current smoker 12/03/2014  . Generalized idiopathic epilepsy and epileptic syndromes, not intractable, without status epilepticus [facility](HCC)     Past Surgical History:  Procedure Laterality Date  . TONSILLECTOMY  ?    as a childhood         Home Medications    Prior to Admission medications   Medication Sig Start Date End Date Taking? Authorizing Provider  clindamycin (CLEOCIN) 150 MG capsule Take 2 capsules (300 mg total) by mouth 3 (three) times daily. May dispense as 150mg  capsules 06/28/18   Garlon HatchetSanders, Lisa M, PA-C  divalproex (DEPAKOTE ER) 500 MG 24 hr tablet Take 4 tablets daily 03/11/18   Van ClinesAquino, Karen M, MD  ibuprofen (ADVIL,MOTRIN) 600 MG tablet Take 1 tablet (600 mg total) by mouth every 6 (six) hours as needed. 01/10/17    Fayrene Helperran, Bowie, PA-C    Family History Family History  Adopted: Yes  Family history unknown: Yes    Social History Social History   Tobacco Use  . Smoking status: Current Some Day Smoker    Packs/day: 0.25    Years: 5.00    Pack years: 1.25    Types: Cigarettes  . Smokeless tobacco: Never Used  Substance Use Topics  . Alcohol use: Yes    Alcohol/week: 0.0 standard drinks    Comment: socially, evevey couple of weekes   . Drug use: No     Allergies   Tegretol [carbamazepine]   Review of Systems Review of Systems  Constitutional: Negative for chills and fever.  HENT: Negative for ear pain and sore throat.   Eyes: Negative for pain and visual disturbance.  Respiratory: Negative for cough and shortness of breath.   Cardiovascular: Negative for chest pain and palpitations.  Gastrointestinal: Negative for abdominal pain and vomiting.  Genitourinary: Negative for dysuria and hematuria.  Musculoskeletal: Negative for arthralgias and back pain.  Skin: Positive for wound. Negative for color change and rash.  Neurological: Positive for seizures. Negative for syncope.  All other systems reviewed and are negative.    Physical Exam Updated Vital Signs SpO2 100% Comment: 4L   Physical Exam  Constitutional: He is oriented to person, place, and time.  He appears well-developed and well-nourished.  HENT:  Head: Normocephalic.  Right Ear: External ear normal.  Left Ear: External ear normal.  Nose: Nose normal.  Mouth/Throat: Oropharynx is clear and moist.  Abrasion to occiput Abrasion to tongue and lip  Eyes: Pupils are equal, round, and reactive to light. Conjunctivae and EOM are normal.  Neck: Normal range of motion. Neck supple.  Cardiovascular: Normal rate, regular rhythm, normal heart sounds and intact distal pulses.  Pulmonary/Chest: Effort normal and breath sounds normal. No respiratory distress. He has no wheezes. He exhibits no tenderness.  Abdominal: Soft. Bowel  sounds are normal. He exhibits no distension and no mass. There is no tenderness. There is no guarding.  Musculoskeletal: Normal range of motion.  Neurological: He is alert and oriented to person, place, and time. He has normal reflexes. He displays normal reflexes. No sensory deficit. He exhibits normal muscle tone. Coordination normal.  Skin: Skin is warm and dry.  Psychiatric: He has a normal mood and affect. His behavior is normal. Judgment and thought content normal.  Nursing note and vitals reviewed.    ED Treatments / Results  Labs (all labs ordered are listed, but only abnormal results are displayed) Labs Reviewed  VALPROIC ACID LEVEL  I-STAT CHEM 8, ED    EKG None  Radiology Ct Head Wo Contrast  Result Date: 09/26/2018 CLINICAL DATA:  Seizure EXAM: CT HEAD WITHOUT CONTRAST TECHNIQUE: Contiguous axial images were obtained from the base of the skull through the vertex without intravenous contrast. COMPARISON:  None. FINDINGS: BRAIN: The ventricles and sulci are normal. No intraparenchymal hemorrhage, mass effect nor midline shift. No acute large vascular territory infarcts. Grey-white matter distinction is maintained. The basal ganglia are unremarkable. No abnormal extra-axial fluid collections. Basal cisterns are not effaced and midline. The brainstem and cerebellar hemispheres are without acute abnormalities. VASCULAR: Unremarkable. SKULL/SOFT TISSUES: No skull fracture. Posterior parietal scalp hematoma and soft tissue swelling. ORBITS/SINUSES: The included ocular globes and orbital contents are normal.The mastoid air cells are clear. The included paranasal sinuses are well-aerated. OTHER: None. IMPRESSION: Posterior parietal scalp hematoma and soft tissue swelling. No acute intracranial abnormality. Electronically Signed   By: Tollie Eth M.D.   On: 09/26/2018 19:25    Procedures Procedures (including critical care time)  Medications Ordered in ED Medications - No data to  display   Initial Impression / Assessment and Plan / ED Course  I have reviewed the triage vital signs and the nursing notes.  Pertinent labs & imaging results that were available during my care of the patient were reviewed by me and considered in my medical decision making (see chart for details).     31 year old male presents today with known seizure disorder and seizure.  He is well-appearing here.  Electrolytes are normal.  Depakote is within therapeutic range.  He has had no seizure activity here and appears to be back to baseline.  Abrasion of his head is been clean and bleeding is controlled.  He has an appointment with his neurologist tomorrow.  He is advised regarding return precautions need for close follow-up and seizure precautions and voiced understanding. Patient complained of worsening headache on discharge and head CT was added on which is negative. Final Clinical Impressions(s) / ED Diagnoses   Final diagnoses:  Seizure [facility])    ED Discharge Orders    None       Margarita Grizzle, MD 09/26/18 1800    Margarita Grizzle, MD 09/26/18 1930

## 2018-09-26 NOTE — ED Triage Notes (Signed)
Patient BIB [facility] from work for seizure lasting 3 min, pt fell to floor. At arrival pt was sitting up postictal, pt did hit his head with hematoma noted to back of head. Pt has hx of seizures and is compliant with Depakote. Pt placed in c-collar by EMS. Pt c/o head pain and jaw pain. Pt did not appear to bite tongue, per EMS.

## 2018-09-26 NOTE — Discharge Instructions (Addendum)
Depakote level is 74 today. No driving, or any activity which could cause you or other's danger if you are to have a seizure. Please keep your appointment with your neurologist tomorrow.

## 2018-09-27 ENCOUNTER — Encounter: Payer: Self-pay | Admitting: Neurology

## 2018-09-27 ENCOUNTER — Ambulatory Visit (INDEPENDENT_AMBULATORY_CARE_PROVIDER_SITE_OTHER): Payer: Self-pay | Admitting: Neurology

## 2018-09-27 DIAGNOSIS — G40309 Generalized idiopathic epilepsy and epileptic syndromes, not intractable, without status epilepticus: Secondary | ICD-10-CM

## 2018-09-27 MED ORDER — DIVALPROEX SODIUM ER 500 MG PO TB24: ORAL_TABLET | ORAL | 11 refills | Status: DC

## 2018-09-27 MED ORDER — LAMOTRIGINE 25 MG PO TABS: ORAL_TABLET | ORAL | 11 refills | Status: DC

## 2018-09-27 MED FILL — lamoTRIgine 25 MG TABS: 25 | 30 days supply | Qty: 30 | Fill #0

## 2018-09-27 NOTE — Progress Notes (Signed)
NEUROLOGY FOLLOW UP OFFICE NOTE  Donald Travis [id number] December 04, 1985  HISTORY OF PRESENT ILLNESS: I had the pleasure of seeing Donald Travis in follow-up in the neurology clinic on 09/27/2018.  The patient was last seen 6 months ago for seizures. He is again accompanied by his fiancee who helps supplement the history today.  Records and images were personally reviewed where available.  His 1-hour wake and sleep EEG was abnormal with generalized 4-5 Hz spike and polyspike and wave discharges with frontal predominance in sleep, at times with right-sided lead-in. He is taking Depakote ER 2000mg  qAM. He had a convulsive seizure yesterday and was in the ER with a Depakote level of 74 (prior level in January 2019 was 117, March 2017 was 13). Head CT showed a posterior parietal scalp hematoma with soft tissue swelling, no acute intracranial abnormality. He denies missing medication, sleep deprivation. He had a little alcohol the night prior. Seizure occurred around 4pm at work, he started having body jerks for around 20 minutes, then had a GTC. He feels tired today. His fiancee denies any staring/unresponsive episodes. He denies any significant headaches, dizziness, vision changes, focal numbness/tingling/weakness.   History on Initial Assessment 03/11/2018: This is a pleasant 32 year old right-handed man with a history of seizures since the 3rd grade, presenting to establish care. He reports hitting his head on the gym floor in the 3rd grade, and seizures starting since then. He was mostly having generalized convulsions and recalls trying different medications until he was started on Depakote in high school with good response. He recalls taking Tegretol at some point, does not recall the other medications. Now he would mostly have a "little petit mal" twitch on the side of his mouth, "almost like a stutter." This is how he feels prior to a seizure, he would take his medication and feel fine, but if he has a  headache after and does not go to sleep, this would proceed to a GTC. He and his fiancee report the last GTC was 2.5 years ago. After a seizure he would stare off. Otherwise his fiancee has not seen any other staring/unresponsive episodes. He denies any myoclonic jerks in his extremities. He denies any olfactory/gustatory hallucinations, rising epigastric sensation. He denies any side effects on Depakote ER 2000mg  qAM. If he takes medication at night, he may have a headache. Otherwise he denies any headaches, dizziness, diplopia, focal numbness/tingling/weakness, bowel/bladder dysfunction. Over the past year, he and his fiancee have noted some short-term memory issues. He would not recall where he put things down 5 minutes earlier or a conversation he had the day prior. He denies getting lost driving, no missed bills. He takes his medication regularly, he has found that missing doses in combination with poor sleep and eating habits can trigger seizures. Last seizure 2.5 years ago occurred in this setting.   Epilepsy Risk Factors:  He was adopted and unaware of family history of seizures. As far as he knows, he had a normal birth and early development.  There is no history of febrile convulsions, CNS infections such as meningitis/encephalitis, significant traumatic brain injury, neurosurgical procedures.   PAST MEDICAL HISTORY: Past Medical History:  Diagnosis Date  . Asthma    in childhood, no asthma problems since age 32   . Epilepsy (HCC) 1993  . Seizure [facility](HCC)     MEDICATIONS: Current Outpatient Medications on File Prior to Visit  Medication Sig Dispense Refill  . clindamycin (CLEOCIN) 150 MG capsule Take 2 capsules (300  mg total) by mouth 3 (three) times daily. May dispense as 150mg  capsules (Patient not taking: Reported on 09/26/2018) 60 capsule 0  . divalproex (DEPAKOTE ER) 500 MG 24 hr tablet Take 4 tablets daily (Patient taking differently: Take 2,000 mg by mouth daily. ) 120 tablet 11  .  ibuprofen (ADVIL,MOTRIN) 600 MG tablet Take 1 tablet (600 mg total) by mouth every 6 (six) hours as needed. (Patient not taking: Reported on 09/26/2018) 30 tablet 0   No current facility-administered medications on file prior to visit.     ALLERGIES: Allergies  Allergen Reactions  . Tegretol [Carbamazepine] Other (See Comments)    Excessive weight gain    FAMILY HISTORY: Family History  Adopted: Yes  Family history unknown: Yes    SOCIAL HISTORY: Social History   Socioeconomic History  . Marital status: Single    Spouse name: Not on file  . Number of children: 1   . Years of education: some colle  . Highest education level: Not on file  Occupational History    Employer: [employer]  Social Needs  . Financial resource strain: Not on file  . Food insecurity:    Worry: Not on file    Inability: Not on file  . Transportation needs:    Medical: Not on file    Non-medical: Not on file  Tobacco Use  . Smoking status: Current Some Day Smoker    Packs/day: 0.25    Years: 5.00    Pack years: 1.25    Types: Cigarettes  . Smokeless tobacco: Never Used  Substance and Sexual Activity  . Alcohol use: Yes    Alcohol/week: 0.0 standard drinks    Comment: socially, evevey couple of weekes   . Drug use: No  . Sexual activity: Yes    Birth control/protection: None  Lifestyle  . Physical activity:    Days per week: Not on file    Minutes per session: Not on file  . Stress: Not on file  Relationships  . Social connections:    Talks on phone: Not on file    Gets together: Not on file    Attends religious service: Not on file    Active member of club or organization: Not on file    Attends meetings of clubs or organizations: Not on file    Relationship status: Not on file  . Intimate partner violence:    Fear of current or ex partner: Not on file    Emotionally abused: Not on file    Physically abused: Not on file    Forced sexual activity: Not on file  Other Topics Concern   . Not on file  Social History Narrative    Pt lives in 2 story home with girlfriend and roommate   Has 1 daughter   Some college education   Works as a DJ    REVIEW OF SYSTEMS: Constitutional: No fevers, chills, or sweats, no generalized fatigue, change in appetite Eyes: No visual changes, double vision, eye pain Ear, nose and throat: No hearing loss, ear pain, nasal congestion, sore throat Cardiovascular: No chest pain, palpitations Respiratory:  No shortness of breath at rest or with exertion, wheezes GastrointestinaI: No nausea, vomiting, diarrhea, abdominal pain, fecal incontinence Genitourinary:  No dysuria, urinary retention or frequency Musculoskeletal:  No neck pain, back pain Integumentary: No rash, pruritus, skin lesions Neurological: as above Psychiatric: No depression, insomnia, anxiety Endocrine: No palpitations, fatigue, diaphoresis, mood swings, change in appetite, change in weight, increased thirst Hematologic/Lymphatic:  No anemia, purpura, petechiae. Allergic/Immunologic: no itchy/runny eyes, nasal congestion, recent allergic reactions, rashes  PHYSICAL EXAM: Vitals:   09/27/18 1151  BP: 140/82  Pulse: 80  SpO2: 98%   General: No acute distress, tired-appearing, flat affect Head:  Normocephalic/atraumatic Neck: supple, no paraspinal tenderness, full range of motion Heart:  Regular rate and rhythm Lungs:  Clear to auscultation bilaterally Back: No paraspinal tenderness Skin/Extremities: No rash, no edema Neurological Exam: alert and oriented to person, place, and time. No aphasia or dysarthria. Fund of knowledge is appropriate.  Recent and remote memory are intact.  Attention and concentration are normal.    Able to name objects and repeat phrases. Cranial nerves: Pupils equal, round, reactive to light. Extraocular movements intact with no nystagmus. Visual fields full. Facial sensation intact. No facial asymmetry. Tongue, uvula, palate midline.  Motor: Bulk  and tone normal, muscle strength 5/5 throughout with no pronator drift.  Sensation to light touch intact.  No extinction to double simultaneous stimulation. Finger to nose testing intact.  Gait narrow-based and steady, able to tandem walk adequately.  Romberg negative.  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 He reports twitching on the side of his mouth/"almost a stutter" and GTCs. 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 He had a GTC yesterday preceded by myoclonic jerks for 20 minutes, he had previously been seizure-free for almost 3 years. He denies missing Depakote ER 500mg  4 tabs qAM, Depakote level yesterday was 74. He had a little alcohol the night prior. We discussed adding on low dose Lamotrigine 25mg  every other night for a week, then increasing to 25mg  every night, side effects including Levonne Spiller syndrome were discussed. We discussed avoidance of seizure triggers, including alcohol, missing medication, and sleep deprivation. [place] driving laws were again discussed with the patient, and he knows to stop driving after a seizure, until 6 months seizure-free. He will follow-up in 6 months and knows to call for any changes.   Thank you for allowing me to participate in his care.  Please do not hesitate to call for any questions or concerns.  The duration of this appointment visit was 20 minutes of face-to-face time with the patient.  Greater than 50% of this time was spent in counseling, explanation of diagnosis, planning of further management, and coordination of care.   Patrcia Dolly, M.D.   CC: Bertram Denver, NP

## 2018-09-27 NOTE — Patient Instructions (Signed)
1. Start Lamotrigine 25mg :Take 1 tablet every other night for 1 week, then increase to 1 tablet every night  2. Continue Depakote ER 500mg : Take 4 tablets every morning  3. Follow-up in 6 months, call for any changes  Seizure Precautions: 1. If medication has been prescribed for you to prevent seizures, take it exactly as directed.  Do not stop taking the medicine without talking to your doctor first, even if you have not had a seizure in a long time.   2. Avoid activities in which a seizure would cause danger to yourself or to others.  Don't operate dangerous machinery, swim alone, or climb in high or dangerous places, such as on ladders, roofs, or girders.  Do not drive unless your doctor says you may.  3. If you have any warning that you may have a seizure, lay down in a safe place where you can't hurt yourself.    4.  No driving for 6 months from last seizure, as per [place] [place].   Please refer to the following link on the Epilepsy Foundation of America's website for more information: http://www.epilepsyfoundation.org/answerplace/Social/driving/drivingu.cfm   5.  Maintain good sleep hygiene. Avoid alcohol.  6.  Contact your doctor if you have any problems that may be related to the medicine you are taking.  7.  Call 911 and bring the patient back to the ED if:        A.  The seizure lasts longer than 5 minutes.       B.  The patient doesn't awaken shortly after the seizure  C.  The patient has new problems such as difficulty seeing, speaking or moving  D.  The patient was injured during the seizure  E.  The patient has a temperature over 102 F (39C)  F.  The patient vomited and now is having trouble breathing

## 2018-10-09 MED FILL — DIVALPROEX SOD ER 500 MG TA: 500 | 25 days supply | Qty: 120 | Fill #0

## 2018-11-12 MED FILL — DIVALPROEX SOD ER 500 MG TA: 500 | 25 days supply | Qty: 120 | Fill #1

## 2018-12-12 MED FILL — DIVALPROEX SOD ER 500 MG TA: 500 | 30 days supply | Qty: 120 | Fill #2

## 2019-01-09 MED FILL — DIVALPROEX SOD ER 500 MG TA: 500 | 30 days supply | Qty: 120 | Fill #3

## 2019-02-06 MED FILL — DIVALPROEX SOD ER 500 MG TA: 500 | 30 days supply | Qty: 120 | Fill #4 | Status: TO

## 2019-02-17 IMAGING — CT CT HEAD W/O CM
3 series · 15 of 47 positions shown, 18 images · non-contrast
Comparison: None.

CLINICAL DATA: Seizure

EXAM:
CT HEAD WITHOUT CONTRAST
TECHNIQUE: Contiguous axial images were obtained from the base of the skull
through the vertex without intravenous contrast.

[Series 2: head wo · axial · 0.47mm/px · z∈[+45,+185]mm · 9 of 34 slices shown, 12 images]
[im 3/34  brain]
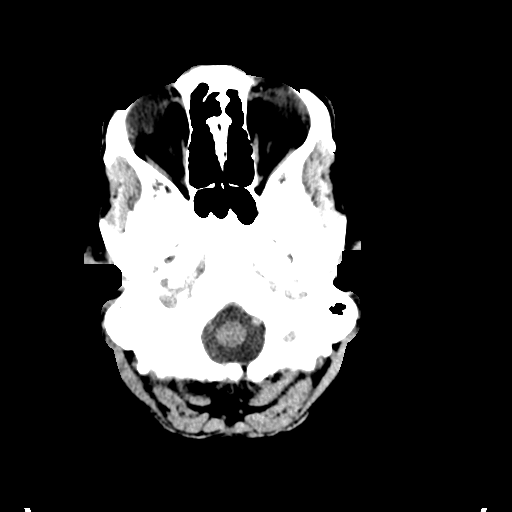
[im 3/34  bone]
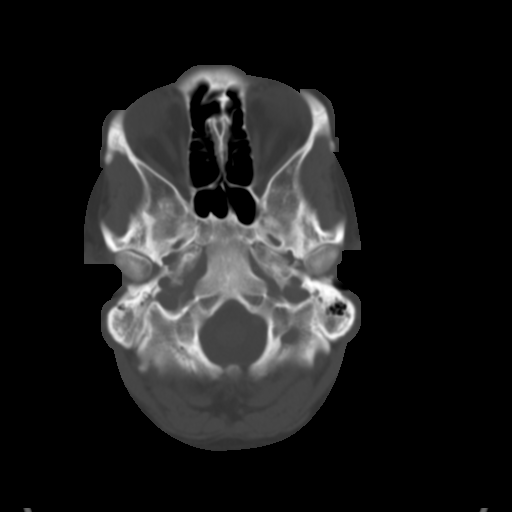
[im 6/34  brain]
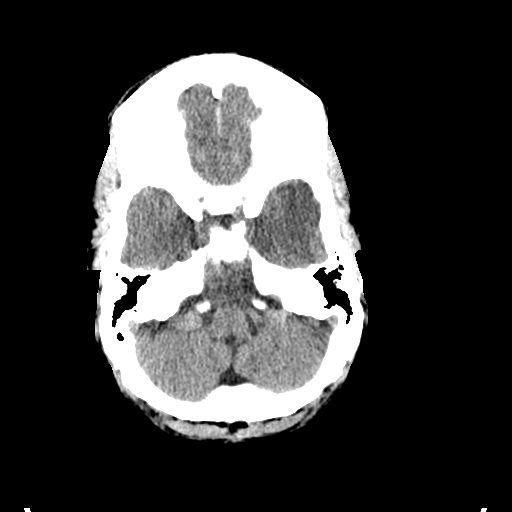
[im 10/34  brain]
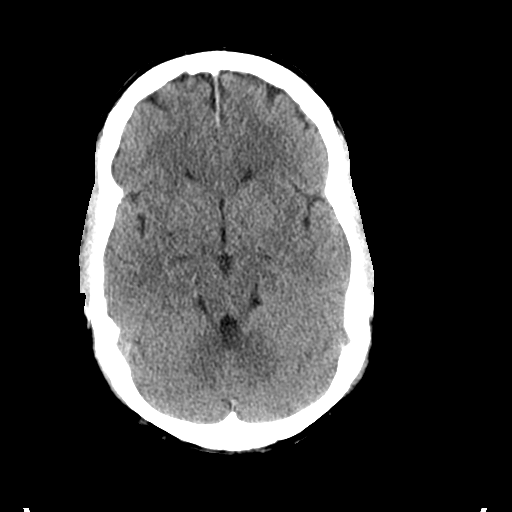
[im 13/34  brain]
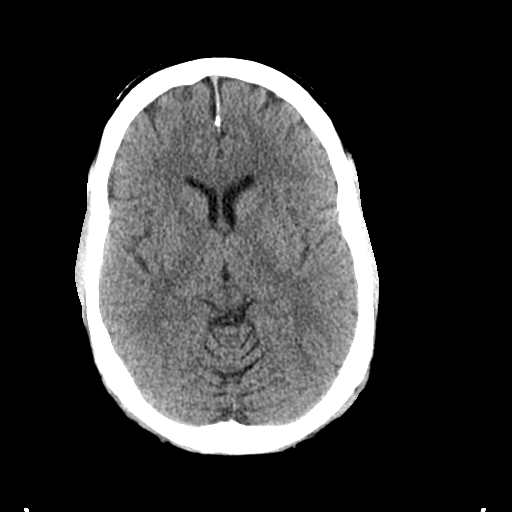
[im 18/34  brain]
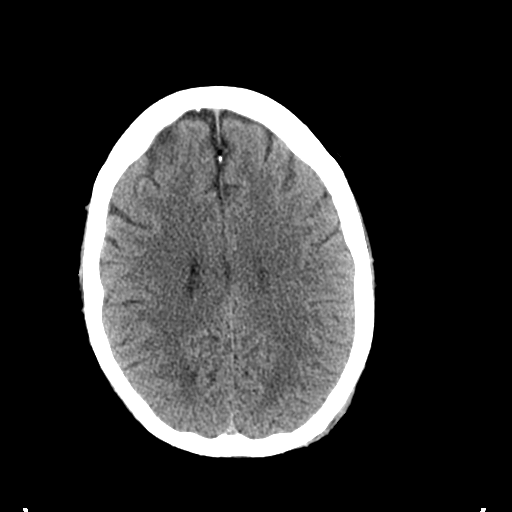
[im 18/34  bone]
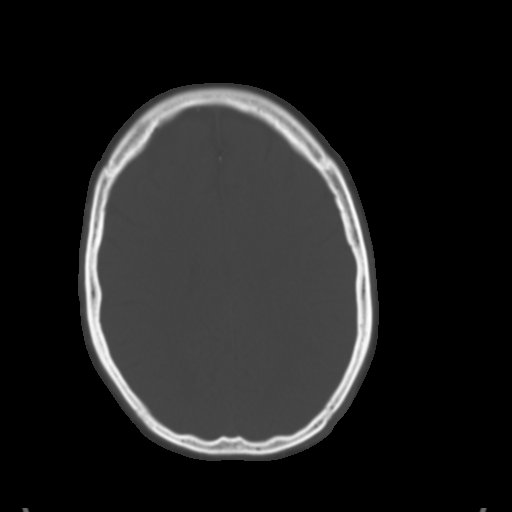
[im 21/34  brain]
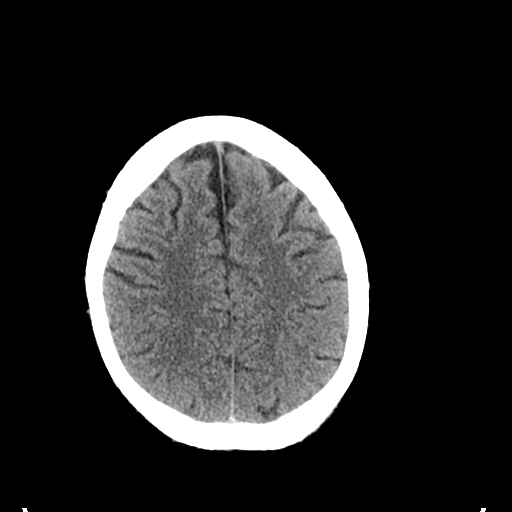
[im 24/34  brain]
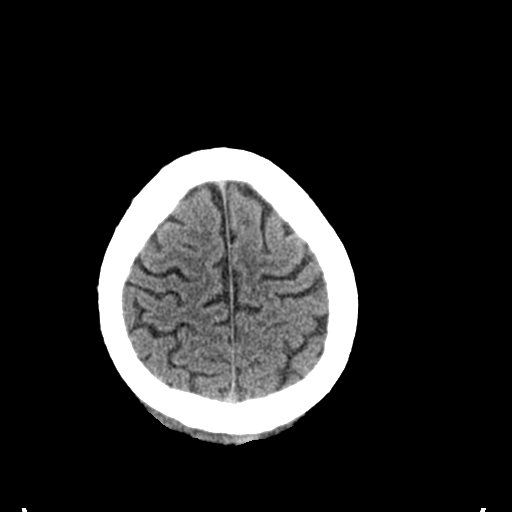
[im 28/34  brain]
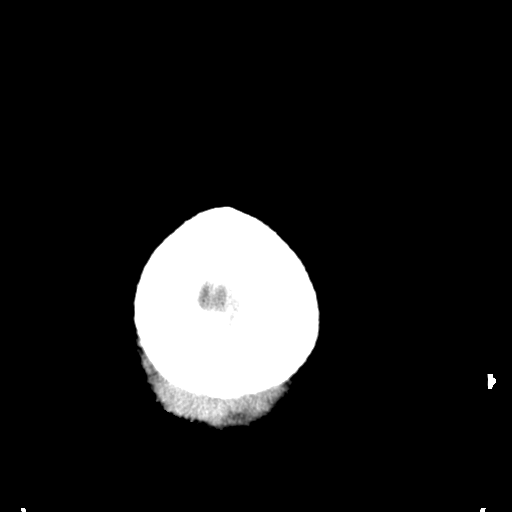
[im 31/34  brain]
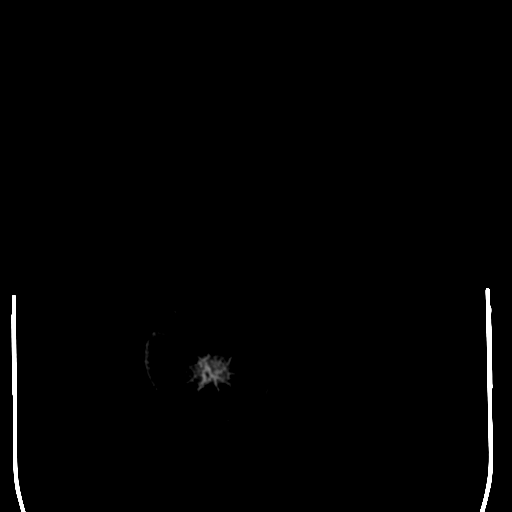
[im 31/34  bone]
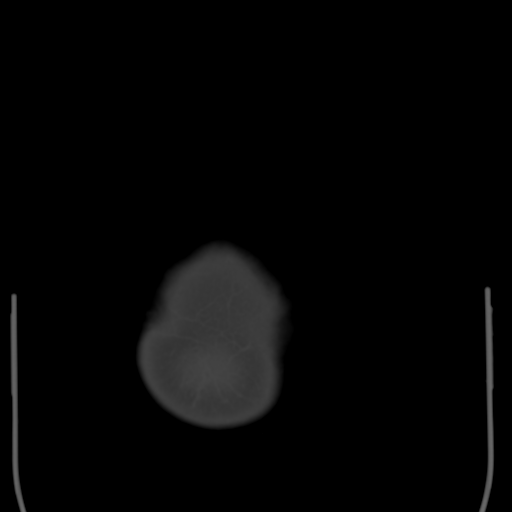

[Series 5: coronal soft tissue · coronal · 0.29mm/px · 3 of 69 slices shown]
[im 23/69  brain]
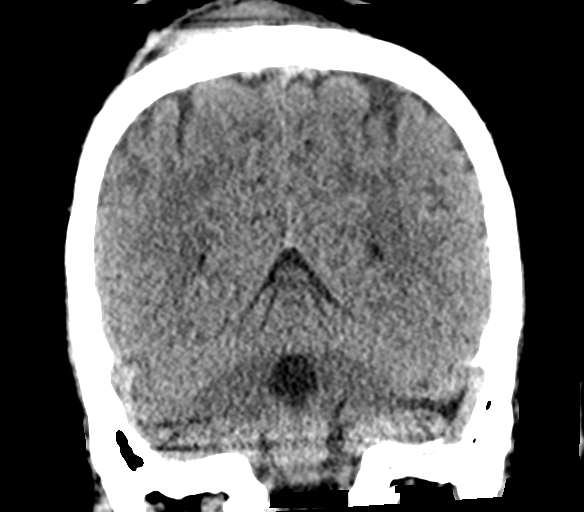
[im 31/69  brain]
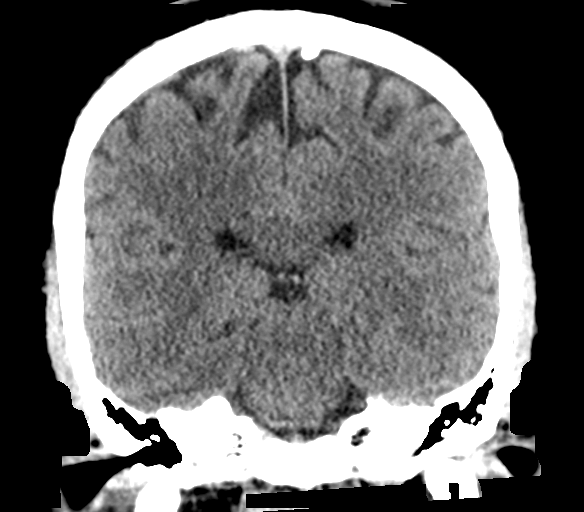
[im 38/69  brain]
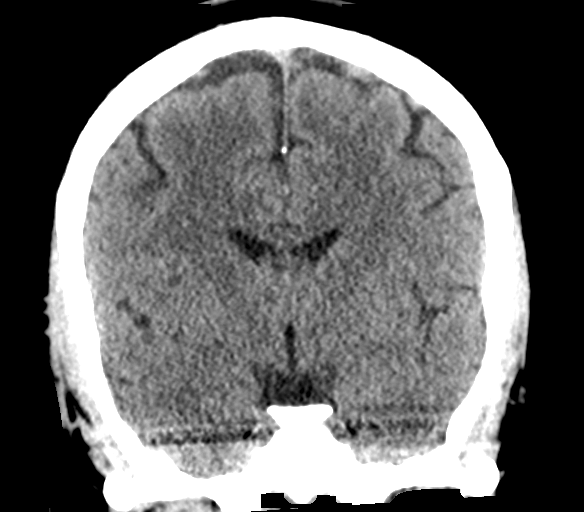

[Series 6: sagittal soft tissue · sagittal · 0.29mm/px · 3 of 56 slices shown]
[im 19/56  brain]
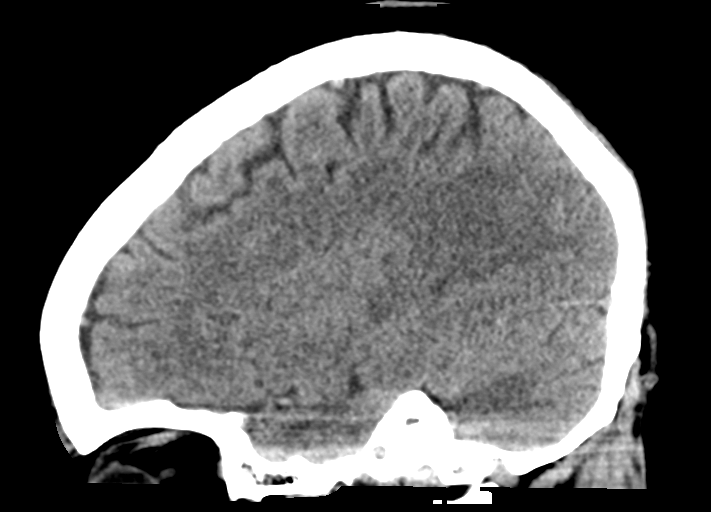
[im 28/56  brain]
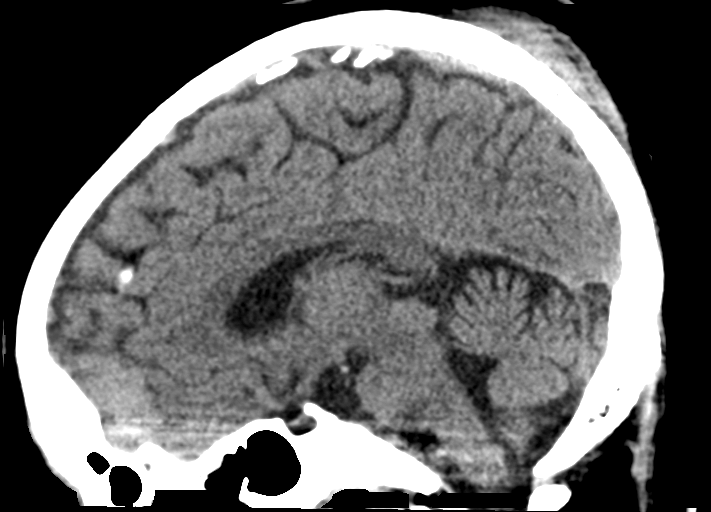
[im 37/56  brain]
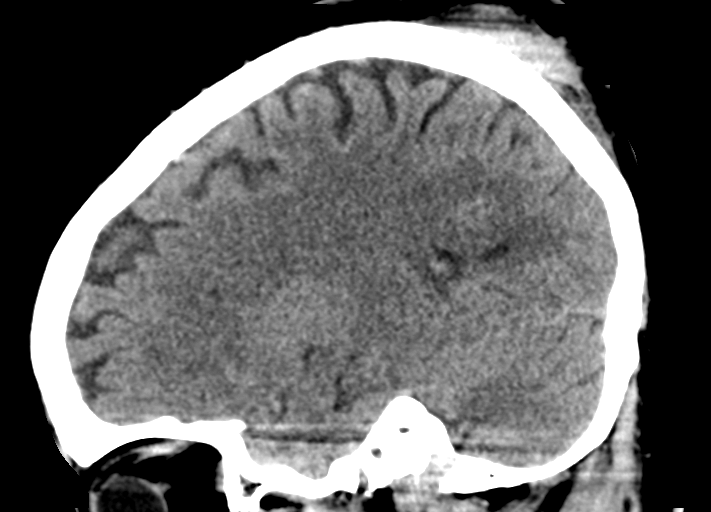

[15 of 47 positions shown; findings below may reference images not displayed]

FINDINGS: BRAIN: The ventricles and sulci are normal. No intraparenchymal
hemorrhage, mass effect nor midline shift. No acute large vascular
territory infarcts. Grey-white matter distinction is maintained. The
basal ganglia are unremarkable. No abnormal extra-axial fluid
collections. Basal cisterns are not effaced and midline. The
brainstem and cerebellar hemispheres are without acute
abnormalities.

VASCULAR: Unremarkable.

SKULL/SOFT TISSUES: No skull fracture. Posterior parietal scalp
hematoma and soft tissue swelling.

ORBITS/SINUSES: The included ocular globes and orbital contents are
normal.The mastoid air cells are clear. The included paranasal
sinuses are well-aerated.

OTHER: None.
IMPRESSION: Posterior parietal scalp hematoma and soft tissue swelling. No acute
intracranial abnormality.

## 2019-02-19 ENCOUNTER — Telehealth: Payer: Self-pay | Admitting: Neurology

## 2019-02-19 ENCOUNTER — Other Ambulatory Visit: Payer: Self-pay

## 2019-03-21 ENCOUNTER — Other Ambulatory Visit: Payer: Self-pay | Admitting: Neurology

## 2019-03-21 DIAGNOSIS — G40309 Generalized idiopathic epilepsy and epileptic syndromes, not intractable, without status epilepticus: Secondary | ICD-10-CM

## 2019-04-04 ENCOUNTER — Other Ambulatory Visit: Payer: Self-pay | Admitting: Neurology

## 2019-04-04 DIAGNOSIS — G40309 Generalized idiopathic epilepsy and epileptic syndromes, not intractable, without status epilepticus: Secondary | ICD-10-CM

## 2019-04-23 ENCOUNTER — Telehealth: Payer: Self-pay | Admitting: Neurology

## 2019-04-23 ENCOUNTER — Other Ambulatory Visit: Payer: Self-pay

## 2019-04-23 DIAGNOSIS — G40309 Generalized idiopathic epilepsy and epileptic syndromes, not intractable, without status epilepticus: Secondary | ICD-10-CM

## 2019-04-23 MED ORDER — DIVALPROEX SODIUM ER 500 MG PO TB24: ORAL_TABLET | ORAL | 0 refills | Status: DC

## 2019-04-23 NOTE — Telephone Encounter (Signed)
Refill on the Depakote medication to the pharm on file. He is also needing about 8 days worth sent to [facility] on [address] in [place], [place]. He is there on vacation and has ran out of meds. Please send to both places. Thanks!

## 2019-04-23 NOTE — Telephone Encounter (Signed)
Pt called informed that script for refill of Depakote was sent to [facility] requested in [place]

## 2019-04-30 ENCOUNTER — Other Ambulatory Visit: Payer: Self-pay

## 2019-04-30 ENCOUNTER — Telehealth: Payer: Self-pay | Admitting: Neurology

## 2019-04-30 DIAGNOSIS — G40309 Generalized idiopathic epilepsy and epileptic syndromes, not intractable, without status epilepticus: Secondary | ICD-10-CM

## 2019-04-30 MED ORDER — DIVALPROEX SODIUM ER 500 MG PO TB24: ORAL_TABLET | ORAL | 5 refills | Status: DC

## 2019-04-30 MED FILL — DIVALPROEX SOD ER 500 MG TA: 500 | 30 days supply | Qty: 120 | Fill #0

## 2019-04-30 NOTE — Telephone Encounter (Signed)
Patient left vm about needing a prescription sent to his pharm,. He didn't leave the name of either. Thanks!

## 2019-05-01 ENCOUNTER — Encounter: Payer: Self-pay | Admitting: Neurology

## 2019-05-01 ENCOUNTER — Other Ambulatory Visit: Payer: Self-pay | Admitting: Neurology

## 2019-05-01 DIAGNOSIS — G40309 Generalized idiopathic epilepsy and epileptic syndromes, not intractable, without status epilepticus: Secondary | ICD-10-CM

## 2019-05-13 ENCOUNTER — Ambulatory Visit: Payer: Self-pay | Admitting: Neurology

## 2019-05-13 ENCOUNTER — Encounter

## 2019-05-28 MED FILL — DIVALPROEX SOD ER 500 MG TA: 500 | 30 days supply | Qty: 120 | Fill #1

## 2019-06-25 MED FILL — DIVALPROEX SOD ER 500 MG TA: 500 | 30 days supply | Qty: 120 | Fill #2

## 2019-07-28 MED FILL — DIVALPROEX SOD ER 500 MG TA: 500 | 30 days supply | Qty: 120 | Fill #3

## 2019-09-03 MED FILL — DIVALPROEX SOD ER 500 MG TA: 500 | 30 days supply | Qty: 120 | Fill #4

## 2019-10-06 MED FILL — DIVALPROEX SOD ER 500 MG TA: 500 | 30 days supply | Qty: 120 | Fill #5

## 2019-11-06 ENCOUNTER — Other Ambulatory Visit: Payer: Self-pay | Admitting: Neurology

## 2019-11-06 DIAGNOSIS — G40309 Generalized idiopathic epilepsy and epileptic syndromes, not intractable, without status epilepticus: Secondary | ICD-10-CM

## 2019-11-06 MED FILL — DIVALPROEX SOD ER 500 MG TA: 500 | 30 days supply | Qty: 120 | Fill #0

## 2019-12-08 MED FILL — DIVALPROEX SOD ER 500 MG TA: 500 | 30 days supply | Qty: 120 | Fill #1

## 2020-01-08 MED FILL — DIVALPROEX SOD ER 500 MG TA: 500 | 30 days supply | Qty: 120 | Fill #2

## 2020-02-12 MED FILL — DIVALPROEX SOD ER 500 MG TA: 500 | 30 days supply | Qty: 120 | Fill #3

## 2020-03-10 ENCOUNTER — Other Ambulatory Visit: Payer: Self-pay | Admitting: Neurology

## 2020-03-10 DIAGNOSIS — G40309 Generalized idiopathic epilepsy and epileptic syndromes, not intractable, without status epilepticus: Secondary | ICD-10-CM

## 2020-03-12 ENCOUNTER — Other Ambulatory Visit: Payer: Self-pay

## 2020-03-12 ENCOUNTER — Telehealth: Payer: Self-pay | Admitting: Neurology

## 2020-03-12 DIAGNOSIS — G40309 Generalized idiopathic epilepsy and epileptic syndromes, not intractable, without status epilepticus: Secondary | ICD-10-CM

## 2020-03-12 MED ORDER — DIVALPROEX SODIUM ER 500 MG PO TB24: ORAL_TABLET | ORAL | 2 refills | Status: DC

## 2020-03-12 MED FILL — DIVALPROEX SOD ER 500 MG TA: 500 | 30 days supply | Qty: 120 | Fill #0

## 2020-03-12 NOTE — Telephone Encounter (Signed)
Patient called needing Divalproex 500 mg refilled. [facility]. Please call

## 2020-03-12 NOTE — Telephone Encounter (Signed)
Patient has an appointment scheduled on 07/27/20 @ 2:30pm.

## 2020-03-12 NOTE — Telephone Encounter (Signed)
Refill sent in for pt to last until appointment

## 2020-04-07 MED FILL — DIVALPROEX SOD ER 500 MG TA: 500 | 30 days supply | Qty: 120 | Fill #1

## 2020-05-12 MED FILL — DIVALPROEX SOD ER 500 MG TA: 500 | 30 days supply | Qty: 120 | Fill #2

## 2020-05-27 ENCOUNTER — Encounter (HOSPITAL_COMMUNITY): Payer: Self-pay

## 2020-05-27 ENCOUNTER — Emergency Department (HOSPITAL_COMMUNITY)
Admission: EM | Admit: 2020-05-27 | Discharge: 2020-05-27 | Disposition: A | Discharge status: Home / Self Care | Payer: Self-pay | Attending: Emergency Medicine | Admitting: Emergency Medicine

## 2020-05-27 DIAGNOSIS — Y9289 Other specified places as the place of occurrence of the external cause: Secondary | ICD-10-CM | POA: Insufficient documentation

## 2020-05-27 DIAGNOSIS — S169XXA Unspecified injury of muscle, fascia and tendon at neck level, initial encounter: Secondary | ICD-10-CM | POA: Insufficient documentation

## 2020-05-27 DIAGNOSIS — Z5321 Procedure and treatment not carried out due to patient leaving prior to being seen by health care provider: Secondary | ICD-10-CM | POA: Insufficient documentation

## 2020-05-27 DIAGNOSIS — M549 Dorsalgia, unspecified: Secondary | ICD-10-CM | POA: Insufficient documentation

## 2020-05-27 DIAGNOSIS — Y9367 Activity, basketball: Secondary | ICD-10-CM | POA: Insufficient documentation

## 2020-05-27 DIAGNOSIS — X58XXXA Exposure to other specified factors, initial encounter: Secondary | ICD-10-CM | POA: Insufficient documentation

## 2020-05-27 DIAGNOSIS — Y999 Unspecified external cause status: Secondary | ICD-10-CM | POA: Insufficient documentation

## 2020-05-27 NOTE — ED Triage Notes (Signed)
Pt was playing basketball and injured his back, the neck day the pain was radiating to his neck and yesterday his legs gave out on him

## 2020-06-09 ENCOUNTER — Other Ambulatory Visit: Payer: Self-pay

## 2020-06-09 ENCOUNTER — Encounter: Payer: Self-pay | Admitting: Neurology

## 2020-06-09 ENCOUNTER — Ambulatory Visit (INDEPENDENT_AMBULATORY_CARE_PROVIDER_SITE_OTHER): Payer: Self-pay | Admitting: Neurology

## 2020-06-09 ENCOUNTER — Other Ambulatory Visit: Payer: Self-pay | Admitting: Neurology

## 2020-06-09 VITALS — BP 139/84 | HR 86 | Ht 69.0 in | Wt 204.0 lb

## 2020-06-09 DIAGNOSIS — G40309 Generalized idiopathic epilepsy and epileptic syndromes, not intractable, without status epilepticus: Secondary | ICD-10-CM

## 2020-06-09 MED ORDER — DIVALPROEX SODIUM ER 500 MG PO TB24: ORAL_TABLET | ORAL | 11 refills | Status: DC

## 2020-06-09 MED FILL — DIVALPROEX SOD ER 500 MG TA: 500 | 30 days supply | Qty: 120 | Fill #0

## 2020-06-09 NOTE — Patient Instructions (Addendum)
Good to see you! Continue Depakote ER 500mg : Take 4 tablets daily. Follow-up in 1 year, call for any changes.  Seizure Precautions: 1. If medication has been prescribed for you to prevent seizures, take it exactly as directed.  Do not stop taking the medicine without talking to your doctor first, even if you have not had a seizure in a long time.   2. Avoid activities in which a seizure would cause danger to yourself or to others.  Don't operate dangerous machinery, swim alone, or climb in high or dangerous places, such as on ladders, roofs, or girders.  Do not drive unless your doctor says you may.  3. If you have any warning that you may have a seizure, lay down in a safe place where you can't hurt yourself.    4.  No driving for 6 months from last seizure, as per [facility].   Please refer to the following link on the Epilepsy Foundation of America's website for more information: http://www.epilepsyfoundation.org/answerplace/Social/driving/drivingu.cfm   5.  Maintain good sleep hygiene. Avoid alcohol.  6.  Contact your doctor if you have any problems that may be related to the medicine you are taking.  7.  Call 911 and bring the patient back to the ED if:        A.  The seizure lasts longer than 5 minutes.       B.  The patient doesn't awaken shortly after the seizure  C.  The patient has new problems such as difficulty seeing, speaking or moving  D.  The patient was injured during the seizure  E.  The patient has a temperature over 102 F (39C)  F.  The patient vomited and now is having trouble breathing

## 2020-06-09 NOTE — Progress Notes (Signed)
NEUROLOGY FOLLOW UP OFFICE NOTE  Donald Travis [id number] [birth year]  HISTORY OF PRESENT ILLNESS: I had the pleasure of seeing Donald Travis in follow-up in the neurology clinic on 06/09/2020.  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 He is alone in the office today. He denies any seizures since 09/2018. This may have been triggered by some alcohol intake the night prior. He has been on Depakote ER 500mg  4 tabs daily (2000mg  daily), Depakote level was 74. We added on low dose Lamotrigine however he stopped it because the small pill size was hard to swallow. He denies any staring/unresponsive episodes, gaps in time, olfactory/gustatory hallucinations, focal numbness/tingling/weakness, myoclonic jerks. He zones in on what he is watching and can get lost in it, but denies gaps in time. He feels that being at home more, sleeping better, have really helped. He works in . He denies any headaches, dizziness, vision changes, no falls. Mood and sleep are good. No side effects on Depakote.   History on Initial Assessment 03/11/2018: This is a pleasant 34 year old right-handed man with a history of seizures since the 3rd grade, presenting to establish care. He reports hitting his head on the gym floor in the 3rd grade, and seizures starting since then. He was mostly having generalized convulsions and recalls trying different medications until he was started on Depakote in high school with good response. He recalls taking Tegretol at some point, does not recall the other medications. Now he would mostly have a "little petit mal" twitch on the side of his mouth, "almost like a stutter." This is how he feels prior to a seizure, he would take his medication and feel fine, but if he has a headache after and does not go to sleep, this would proceed to a GTC. He and his fiancee report the last GTC was 2.5 years ago. After a seizure he would stare off. Otherwise his fiancee has not seen  any other staring/unresponsive episodes. He denies any myoclonic jerks in his extremities. He denies any olfactory/gustatory hallucinations, rising epigastric sensation. He denies any side effects on Depakote ER 2000mg  qAM. If he takes medication at night, he may have a headache. Otherwise he denies any headaches, dizziness, diplopia, focal numbness/tingling/weakness, bowel/bladder dysfunction. Over the past year, he and his fiancee have noted some short-term memory issues. He would not recall where he put things down 5 minutes earlier or a conversation he had the day prior. He denies getting lost driving, no missed bills. He takes his medication regularly, he has found that missing doses in combination with poor sleep and eating habits can trigger seizures. Last seizure 2.5 years ago occurred in this setting.   Epilepsy Risk Factors:  He was adopted and unaware of family history of seizures. As far as he knows, he had a normal birth and early development.  There is no history of febrile convulsions, CNS infections such as meningitis/encephalitis, significant traumatic brain injury, neurosurgical procedures.   PAST MEDICAL HISTORY: Past Medical History:  Diagnosis Date  . Asthma    in childhood, no asthma problems since age 34   . Epilepsy (HCC) 1993  . Seizure [facility])     MEDICATIONS: Current Outpatient Medications on File Prior to Visit  Medication Sig Dispense Refill  . divalproex (DEPAKOTE ER) 500 MG 24 hr tablet Take 4 tablets by mouth once daily 120 tablet 2  . ibuprofen (ADVIL,MOTRIN) 600 MG tablet Take 1 tablet (600 mg total) by mouth every 6 (  six) hours as needed. 30 tablet 0  . lamoTRIgine (LAMICTAL) 25 MG tablet Take 1 tablet every other night for 1 week, then increase to 1 tablet every night (Patient not taking: Reported on 06/09/2020) 30 tablet 11   No current facility-administered medications on file prior to visit.    ALLERGIES: Allergies  Allergen Reactions  . Tegretol  [Carbamazepine] Other (See Comments)    Excessive weight gain    FAMILY HISTORY: Family History  Adopted: Yes  Family history unknown: Yes    SOCIAL HISTORY: Social History   Socioeconomic History  . Marital status: Single    Spouse name: Not on file  . Number of children: 1   . Years of education: some colle  . Highest education level: Not on file  Occupational History    Employer: [employer]  Tobacco Use  . Smoking status: Current Some Day Smoker    Packs/day: 0.25    Years: 5.00    Pack years: 1.25    Types: Cigarettes  . Smokeless tobacco: Never Used  Vaping Use  . Vaping Use: Never used  Substance and Sexual Activity  . Alcohol use: Yes    Alcohol/week: 0.0 standard drinks    Comment: socially, evevey couple of weekes   . Drug use: No  . Sexual activity: Yes    Birth control/protection: None  Other Topics Concern  . Not on file  Social History Narrative    Pt lives in 2 story home with girlfriend and roommate   Has 1 daughter   Some college education   Works as a DJ   Right handed    Social Determinants of Corporate investment banker Strain:   . Difficulty of Paying Living Expenses:   Food Insecurity:   . Worried About Programme researcher, broadcasting/film/video in the Last Year:   . Barista in the Last Year:   Transportation Needs:   . Freight forwarder (Medical):   Marland Kitchen Lack of Transportation (Non-Medical):   Physical Activity:   . Days of Exercise per Week:   . Minutes of Exercise per Session:   Stress:   . Feeling of Stress :   Social Connections:   . Frequency of Communication with Friends and Family:   . Frequency of Social Gatherings with Friends and Family:   . Attends Religious Services:   . Active Member of Clubs or Organizations:   . Attends Banker Meetings:   Marland Kitchen Marital Status:   Intimate Partner Violence:   . Fear of Current or Ex-Partner:   . Emotionally Abused:   Marland Kitchen Physically Abused:   . Sexually Abused:     PHYSICAL  EXAM: Vitals:   06/09/20 1115  BP: 139/84  Pulse: 86  SpO2: 98%   General: No acute distress Head:  Normocephalic/atraumatic Skin/Extremities: No rash, no edema Neurological Exam: alert and oriented to person, place, and time. No aphasia or dysarthria. Fund of knowledge is appropriate.  Recent and remote memory are intact.  Attention and concentration are normal.    Able to name objects and repeat phrases. Cranial nerves: Pupils equal, round, reactive to light.   Extraocular movements intact with no nystagmus. Visual fields full. No facial asymmetry. Motor: Bulk and tone normal, muscle strength 5/5 throughout with no pronator drift.  Deep tendon reflexes 2+ throughout, toes downgoing.  Finger to nose testing intact.  Gait narrow-based and steady, able to tandem walk adequately.  Romberg negative.   IMPRESSION: This is a pleasant  34 yo RH man with a history of seizures since childhood suggestive of primary generalized epilepsy. He reports twitching on the side of his mouth/"almost a stutter" and GTCs. 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 No further seizures since 09/2018. Continue Depakote ER 500mg  4 tabs daily (2000mg  daily). He is aware of [place] driving laws to stop driving after a seizure until 6 months seizure-free. Continue follow-up with PCP for safety labs (CBC, CMP). Follow-up in 1 year, he knows to call for any changes.    Thank you for allowing me to participate in his care.  Please do not hesitate to call for any questions or concerns.   , M.D.   CC: , NP

## 2020-06-17 MED FILL — DIVALPROEX SOD ER 500 MG TA: 500 | 30 days supply | Qty: 120 | Fill #0

## 2020-07-19 MED FILL — DIVALPROEX SOD ER 500 MG TA: 500 | 30 days supply | Qty: 120 | Fill #1

## 2020-07-27 ENCOUNTER — Ambulatory Visit: Payer: Self-pay | Admitting: Neurology

## 2020-07-28 ENCOUNTER — Ambulatory Visit: Payer: Self-pay | Attending: Nurse Practitioner | Admitting: Nurse Practitioner

## 2020-08-24 MED FILL — DIVALPROEX SOD ER 500 MG TA: 500 | 30 days supply | Qty: 120 | Fill #2

## 2020-10-04 MED FILL — DIVALPROEX SOD ER 500 MG TA: 500 | 30 days supply | Qty: 120 | Fill #3

## 2020-11-15 MED FILL — DIVALPROEX SOD ER 500 MG TA: 500 | 30 days supply | Qty: 120 | Fill #4

## 2020-12-27 MED FILL — DIVALPROEX SOD ER 500 MG TA: 500 | 30 days supply | Qty: 120 | Fill #5

## 2021-02-02 MED FILL — DIVALPROEX SOD ER 500 MG TA: 500 | 30 days supply | Qty: 120 | Fill #6

## 2021-03-11 ENCOUNTER — Other Ambulatory Visit: Payer: Self-pay

## 2021-03-11 MED FILL — Divalproex Sodium Tab ER 24 HR 500 MG: ORAL | 30 days supply | Qty: 120 | Fill #0 | Status: AC

## 2021-04-14 ENCOUNTER — Other Ambulatory Visit: Payer: Self-pay

## 2021-04-14 MED FILL — Divalproex Sodium Tab ER 24 HR 500 MG: ORAL | 30 days supply | Qty: 120 | Fill #1 | Status: AC

## 2021-04-16 ENCOUNTER — Other Ambulatory Visit (HOSPITAL_COMMUNITY): Payer: Self-pay

## 2021-04-16 MED FILL — Divalproex Sodium Tab ER 24 HR 500 MG: ORAL | 2 days supply | Qty: 8 | Fill #2 | Status: AC

## 2021-04-18 ENCOUNTER — Other Ambulatory Visit: Payer: Self-pay

## 2021-05-26 ENCOUNTER — Other Ambulatory Visit: Payer: Self-pay

## 2021-05-26 MED FILL — Divalproex Sodium Tab ER 24 HR 500 MG: ORAL | 2 days supply | Qty: 8 | Fill #3 | Status: CN

## 2021-05-27 ENCOUNTER — Emergency Department (HOSPITAL_COMMUNITY): Admission: EM | Admit: 2021-05-27 | Discharge: 2021-05-28 | Discharge status: Against Medical Advice | Payer: Self-pay

## 2021-05-27 ENCOUNTER — Other Ambulatory Visit: Payer: Self-pay

## 2021-05-27 MED FILL — Divalproex Sodium Tab ER 24 HR 500 MG: ORAL | 30 days supply | Qty: 120 | Fill #3 | Status: AC

## 2021-05-28 NOTE — ED Notes (Signed)
Pt left before triage.

## 2021-06-09 ENCOUNTER — Ambulatory Visit: Payer: Self-pay | Admitting: Neurology

## 2021-06-30 ENCOUNTER — Other Ambulatory Visit: Payer: Self-pay | Admitting: Neurology

## 2021-06-30 DIAGNOSIS — G40309 Generalized idiopathic epilepsy and epileptic syndromes, not intractable, without status epilepticus: Secondary | ICD-10-CM

## 2021-07-01 ENCOUNTER — Other Ambulatory Visit: Payer: Self-pay

## 2021-07-07 ENCOUNTER — Other Ambulatory Visit: Payer: Self-pay

## 2021-07-07 ENCOUNTER — Telehealth: Payer: Self-pay | Admitting: Neurology

## 2021-07-07 NOTE — Telephone Encounter (Signed)
Pt made an appt for a follow up 07/25/21. He is asking for a refill of his meds until then.

## 2021-07-08 ENCOUNTER — Other Ambulatory Visit: Payer: Self-pay

## 2021-07-08 DIAGNOSIS — G40309 Generalized idiopathic epilepsy and epileptic syndromes, not intractable, without status epilepticus: Secondary | ICD-10-CM

## 2021-07-08 MED ORDER — DIVALPROEX SODIUM ER 500 MG PO TB24
ORAL_TABLET | ORAL | 0 refills | Status: DC
  Filled 2021-07-08: qty 80, 20d supply, fill #0

## 2021-07-08 NOTE — Telephone Encounter (Signed)
Refill sent in to last until appointment to pharmacy on file

## 2021-07-25 ENCOUNTER — Ambulatory Visit (INDEPENDENT_AMBULATORY_CARE_PROVIDER_SITE_OTHER): Payer: Self-pay | Admitting: Neurology

## 2021-07-25 ENCOUNTER — Encounter: Payer: Self-pay | Admitting: Neurology

## 2021-07-25 ENCOUNTER — Other Ambulatory Visit: Payer: Self-pay

## 2021-07-25 VITALS — BP 122/80 | HR 72 | Ht 69.0 in | Wt 171.2 lb

## 2021-07-25 DIAGNOSIS — G40309 Generalized idiopathic epilepsy and epileptic syndromes, not intractable, without status epilepticus: Secondary | ICD-10-CM

## 2021-07-25 MED ORDER — DIVALPROEX SODIUM ER 500 MG PO TB24
ORAL_TABLET | ORAL | 11 refills | Status: DC
  Filled 2021-07-25: qty 120, 30d supply, fill #0
  Filled 2021-09-08: qty 120, 30d supply, fill #1
  Filled 2021-10-13: qty 120, 30d supply, fill #2
  Filled 2021-11-17: qty 120, 30d supply, fill #0
  Filled 2021-11-17: qty 120, 30d supply, fill #3
  Filled 2021-12-16: qty 120, 30d supply, fill #1
  Filled 2022-01-16: qty 120, 30d supply, fill #2
  Filled 2022-02-14: qty 120, 30d supply, fill #3
  Filled 2022-03-24: qty 120, 30d supply, fill #4
  Filled 2022-04-28: qty 120, 30d supply, fill #5
  Filled 2022-06-06: qty 120, 30d supply, fill #6
  Filled 2022-07-20: qty 120, 30d supply, fill #7

## 2021-07-25 NOTE — Progress Notes (Signed)
NEUROLOGY FOLLOW UP OFFICE NOTE  Donald Travis [id number] 10-14-1986  HISTORY OF PRESENT ILLNESS: I had the pleasure of seeing Donald Travis in follow-up in the neurology clinic on 07/25/2021.  The patient was last seen a year ago for well-controlled primary generalized epilepsy. He is alone in the office today. Since his last visit, he continues to do well with convulsions since 09/2018 on Depakote ER 500mg  4 tabs every morning (2000mg  daily), no side effects. Around 2 months ago, he had a brief twitch in his mouth, he had missed his medication for 2 days due to work schedule and lack of sleep, it did not progress. He lives with his parents and works as a . He denies any staring/unresponsive episodes, gaps in time, olfactory/gustatory hallucinations, focal numbness/tingling/weakness. No headaches, dizziness, vision changes, no falls. Sleep is okay now. Mood is good. He has a 71 year old daughter and stays active and eats healthy.   History on Initial Assessment 03/11/2018: This is a pleasant 35 year old right-handed man with a history of seizures since the 3rd grade, presenting to establish care. He reports hitting his head on the gym floor in the 3rd grade, and seizures starting since then. He was mostly having generalized convulsions and recalls trying different medications until he was started on Depakote in high school with good response. He recalls taking Tegretol at some point, does not recall the other medications. Now he would mostly have a "little petit mal" twitch on the side of his mouth, "almost like a stutter." This is how he feels prior to a seizure, he would take his medication and feel fine, but if he has a headache after and does not go to sleep, this would proceed to a GTC. He and his fiancee report the last GTC was 2.5 years ago. After a seizure he would stare off. Otherwise his fiancee has not seen any other staring/unresponsive episodes. He denies any myoclonic jerks in his  extremities. He denies any olfactory/gustatory hallucinations, rising epigastric sensation. He denies any side effects on Depakote ER 2000mg  qAM. If he takes medication at night, he may have a headache. Otherwise he denies any headaches, dizziness, diplopia, focal numbness/tingling/weakness, bowel/bladder dysfunction. Over the past year, he and his fiancee have noted some short-term memory issues. He would not recall where he put things down 5 minutes earlier or a conversation he had the day prior. He denies getting lost driving, no missed bills. He takes his medication regularly, he has found that missing doses in combination with poor sleep and eating habits can trigger seizures. Last seizure 2.5 years ago occurred in this setting.    Epilepsy Risk Factors:  He was adopted and unaware of family history of seizures. As far as he knows, he had a normal birth and early development.  There is no history of febrile convulsions, CNS infections such as meningitis/encephalitis, significant traumatic brain injury, neurosurgical procedures.    PAST MEDICAL HISTORY: Past Medical History:  Diagnosis Date   Asthma    in childhood, no asthma problems since age 71    Epilepsy (HCC) 1993   Seizure (HCC)     MEDICATIONS: Current Outpatient Medications on File Prior to Visit  Medication Sig Dispense Refill   acetaminophen (TYLENOL) 325 MG tablet Take 650 mg by mouth every 6 (six) hours as needed.     divalproex (DEPAKOTE ER) 500 MG 24 hr tablet TAKE 4 TABLETS BY MOUTH ONCE DAILY 80 tablet 0   ibuprofen (ADVIL,MOTRIN) 600 MG tablet  Take 1 tablet (600 mg total) by mouth every 6 (six) hours as needed. 30 tablet 0   No current facility-administered medications on file prior to visit.    ALLERGIES: Allergies  Allergen Reactions   Tegretol [Carbamazepine] Other (See Comments)    Excessive weight gain    FAMILY HISTORY: Family History  Adopted: Yes  Family history unknown: Yes    SOCIAL  HISTORY: Social History   Socioeconomic History   Marital status: Single    Spouse name: Not on file   Number of children: 1    Years of education: some colle   Highest education level: Not on file  Occupational History    Employer: [employer]  Tobacco Use   Smoking status: Some Days    Packs/day: 0.25    Years: 5.00    Pack years: 1.25    Types: Cigarettes   Smokeless tobacco: Never  Vaping Use   Vaping Use: Never used  Substance and Sexual Activity   Alcohol use: Yes    Alcohol/week: 0.0 standard drinks    Comment: socially, evevey couple of weekes    Drug use: No   Sexual activity: Yes    Birth control/protection: None  Other Topics Concern   Not on file  Social History Narrative    Pt lives in 2 story home with girlfriend and roommate   Has 1 daughter   Some college education   Works as a DJ   Right handed    Social Determinants of Corporate investment banker Strain: Not on Ship broker Insecurity: Not on file  Transportation Needs: Not on file  Physical Activity: Not on file  Stress: Not on file  Social Connections: Not on file  Intimate Partner Violence: Not on file     PHYSICAL EXAM: Vitals:   07/25/21 1455  BP: 122/80  Pulse: 72  SpO2: 98%   General: No acute distress Head:  Normocephalic/atraumatic Skin/Extremities: No rash, no edema Neurological Exam: alert and awake. No aphasia or dysarthria. Fund of knowledge is appropriate.  Recent and remote memory are intact.  Attention and concentration are normal.   Cranial nerves: Pupils equal, round. Extraocular movements intact with no nystagmus. Visual fields full.  No facial asymmetry.  Motor: Bulk and tone normal, muscle strength 5/5 throughout with no pronator drift.   Finger to nose testing intact.  Gait narrow-based and steady, able to tandem walk adequately. Minimal postural tremor.   IMPRESSION: This is a pleasant 35 yo RH man with a history of seizures with primary generalized epilepsy. He  reports twitching on the side of his mouth/"almost a stutter" and GTCs. 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 No convulsions since 09/2018 on Depakote ER 500mg  4 tablets daily (2000mg  daily). We again discussed avoidance of seizure triggers and [place] driving laws. DMV forms will be filled out for him. Follow-up in 1 yaer, he knows to call for any changes.   Thank you for allowing me to participate in his care.  Please do not hesitate to call for any questions or concerns.   , M.D.   CC: , NP

## 2021-07-25 NOTE — Patient Instructions (Signed)
Good to see you! Continue Depakote ER 500mg : take 4 tablets daily, refills sent. Follow-up in 1 year, call for any changes.   Seizure Precautions: 1. If medication has been prescribed for you to prevent seizures, take it exactly as directed.  Do not stop taking the medicine without talking to your doctor first, even if you have not had a seizure in a long time.   2. Avoid activities in which a seizure would cause danger to yourself or to others.  Don't operate dangerous machinery, swim alone, or climb in high or dangerous places, such as on ladders, roofs, or girders.  Do not drive unless your doctor says you may.  3. If you have any warning that you may have a seizure, lay down in a safe place where you can't hurt yourself.    4.  No driving for 6 months from last seizure, as per [facility].   Please refer to the following link on the Epilepsy Foundation of America's website for more information: http://www.epilepsyfoundation.org/answerplace/Social/driving/drivingu.cfm   5.  Maintain good sleep hygiene. Avoid alcohol.  6.  Contact your doctor if you have any problems that may be related to the medicine you are taking.  7.  Call 911 and bring the patient back to the ED if:        A.  The seizure lasts longer than 5 minutes.       B.  The patient doesn't awaken shortly after the seizure  C.  The patient has new problems such as difficulty seeing, speaking or moving  D.  The patient was injured during the seizure  E.  The patient has a temperature over 102 F (39C)  F.  The patient vomited and now is having trouble breathing

## 2021-07-27 ENCOUNTER — Other Ambulatory Visit: Payer: Self-pay

## 2021-09-08 ENCOUNTER — Other Ambulatory Visit: Payer: Self-pay

## 2021-09-26 DIAGNOSIS — Z0279 Encounter for issue of other medical certificate: Secondary | ICD-10-CM

## 2021-10-13 ENCOUNTER — Other Ambulatory Visit: Payer: Self-pay

## 2021-10-14 ENCOUNTER — Other Ambulatory Visit: Payer: Self-pay

## 2021-11-17 ENCOUNTER — Other Ambulatory Visit: Payer: Self-pay

## 2021-11-18 ENCOUNTER — Other Ambulatory Visit: Payer: Self-pay

## 2021-12-16 ENCOUNTER — Other Ambulatory Visit: Payer: Self-pay

## 2022-01-16 ENCOUNTER — Other Ambulatory Visit: Payer: Self-pay

## 2022-01-17 ENCOUNTER — Other Ambulatory Visit: Payer: Self-pay

## 2022-01-26 ENCOUNTER — Other Ambulatory Visit: Payer: Self-pay

## 2022-01-26 MED ORDER — BENZONATATE 100 MG PO CAPS
ORAL_CAPSULE | ORAL | 0 refills | Status: DC
  Filled 2022-01-26: qty 15, 5d supply, fill #0

## 2022-01-26 MED ORDER — AZITHROMYCIN 250 MG PO TABS
ORAL_TABLET | ORAL | 0 refills | Status: DC
  Filled 2022-01-26: qty 6, 5d supply, fill #0

## 2022-02-14 ENCOUNTER — Other Ambulatory Visit: Payer: Self-pay

## 2022-02-15 ENCOUNTER — Other Ambulatory Visit: Payer: Self-pay

## 2022-03-06 ENCOUNTER — Other Ambulatory Visit: Payer: Self-pay

## 2022-03-06 ENCOUNTER — Encounter (HOSPITAL_BASED_OUTPATIENT_CLINIC_OR_DEPARTMENT_OTHER): Payer: Self-pay

## 2022-03-06 ENCOUNTER — Emergency Department (HOSPITAL_BASED_OUTPATIENT_CLINIC_OR_DEPARTMENT_OTHER)
Admission: EM | Admit: 2022-03-06 | Discharge: 2022-03-06 | Disposition: A | Discharge status: Home / Self Care | Payer: Self-pay | Attending: Emergency Medicine | Admitting: Emergency Medicine

## 2022-03-06 DIAGNOSIS — H5789 Other specified disorders of eye and adnexa: Secondary | ICD-10-CM | POA: Insufficient documentation

## 2022-03-06 DIAGNOSIS — H109 Unspecified conjunctivitis: Secondary | ICD-10-CM | POA: Insufficient documentation

## 2022-03-06 DIAGNOSIS — B349 Viral infection, unspecified: Secondary | ICD-10-CM | POA: Insufficient documentation

## 2022-03-06 LAB — GROUP A STREP BY PCR: Group A Strep by PCR: NOT DETECTED

## 2022-03-06 NOTE — Discharge Instructions (Signed)
Continue taking Tylenol and Motrin as needed.  Wash away any mucus buildup at home around your eyes.  Drink plenty of fluids.  Avoid close contact with other people as you may continue to be contagious. ? ?Follow-up with your primary care doctor this week.  However return to the ER if you have difficulty breathing recurrent fevers worsening symptoms or any additional concerns. ?

## 2022-03-06 NOTE — ED Provider Notes (Signed)
?[facility] ?Provider Note ? ? ?CSN: [id number] ?Arrival date & time: 03/06/22  [id number] ? ?  ? ?History ? ?Chief Complaint  ?Patient presents with  ? Sore Throat  ? Eye Drainage  ? Nasal Congestion  ? ? ?Donald Travis is a 36 y.o. male. ? ?Patient presents with 2 days of fullness sensation in his ears, sore throat, nasal congestion.  He noticed red eyes and increased mucus discharge from his eyes in the last 2 days as well.  He had fevers 2 days ago but no fevers today.  Denies any vomiting or diarrhea.  His son is also sick with similar symptoms he states.  Denies any recent sexual contact. ? ? ?  ? ?Home Medications ?Prior to Admission medications   ?Medication Sig Start Date End Date Taking? Authorizing Provider  ?acetaminophen (TYLENOL) 325 MG tablet Take 650 mg by mouth every 6 (six) hours as needed.    [provider]  ?azithromycin (ZITHROMAX) 250 MG tablet Take 2 tablets by mouth today, then 1 tablet daily for the next 4 days. 01/26/22     ?benzonatate (TESSALON) 100 MG capsule Take 1 capsule by mouth every 8 hours as needed for cough. 01/26/22     ?divalproex (DEPAKOTE ER) 500 MG 24 hr tablet Take 4 tablets daily 07/25/21   Cameron Sprang, MD  ?ibuprofen (ADVIL,MOTRIN) 600 MG tablet Take 1 tablet (600 mg total) by mouth every 6 (six) hours as needed. 01/10/17   Domenic Moras, PA-C  ?   ? ?Allergies    ?Tegretol [carbamazepine]   ? ?Review of Systems   ?Review of Systems  ?Constitutional:  Positive for fever.  ?HENT:  Negative for ear pain and sore throat.   ?Eyes:  Negative for pain.  ?Respiratory:  Negative for cough.   ?Cardiovascular:  Negative for chest pain.  ?Gastrointestinal:  Negative for abdominal pain.  ?Genitourinary:  Negative for flank pain.  ?Musculoskeletal:  Negative for back pain.  ?Skin:  Negative for color change and rash.  ?Neurological:  Negative for syncope.  ?All other systems reviewed and are negative. ? ?Physical Exam ?Updated Vital Signs ?BP (!) 137/96  (BP Location: Right Arm)   Pulse 61   Temp 98.1 ?F (36.7 ?C)   Resp 14   SpO2 99%  ?Physical Exam ?Constitutional:   ?   Appearance: He is well-developed.  ?HENT:  ?   Head: Normocephalic.  ?   Nose: Nose normal.  ?   Mouth/Throat:  ?   Pharynx: Uvula midline.  ?   Tonsils: No tonsillar exudate or tonsillar abscesses.  ?Eyes:  ?   Extraocular Movements: Extraocular movements intact.  ?   Comments: Bilateral moderate conjunctivitis noted.  Clear mucus discharge noted.  Extraocular motion otherwise intact pupils equal reactive bilaterally to light.  ?Cardiovascular:  ?   Rate and Rhythm: Normal rate.  ?Pulmonary:  ?   Effort: Pulmonary effort is normal.  ?Skin: ?   Coloration: Skin is not jaundiced.  ?Neurological:  ?   Mental Status: He is alert. Mental status is at baseline.  ? ? ?ED Results / Procedures / Treatments   ?Labs ?(all labs ordered are listed, but only abnormal results are displayed) ?Labs Reviewed  ?GROUP A STREP BY PCR  ? ? ?EKG ?None ? ?Radiology ?No results found. ? ?Procedures ?Procedures  ? ? ?Medications Ordered in ED ?Medications - No data to display ? ?ED Course/ Medical Decision Making/ A&P ?  ?                        ?  Medical Decision Making ? ?Initial vital signs are within normal limits. ? ?Chart review shows prior ER visit for bacterial bronchitis back in March 2023. ? ?Clinically I suspect viral illness perhaps adenovirus.  Presents with conjunctivitis sore throat, fever, ear fullness, nasal congestion. ? ?Patient otherwise appears in stable condition.  Recommending increase fluid intake at home and rest from work for the next several days.  Advised outpatient follow-up with his doctor within a week.  Advising immediate return for worsening symptoms difficulty breathing or any additional concerns. ? ? ? ? ? ? ? ?Final Clinical Impression(s) / ED Diagnoses ?Final diagnoses:  ?Viral illness  ? ? ?Rx / DC Orders ?ED Discharge Orders   ? ? None  ? ?  ? ? ?  ?Luna Fuse, MD ?03/06/22  [phone] ? ?

## 2022-03-06 NOTE — ED Triage Notes (Signed)
Pt presents POV with nasal congestion, Left ear pain and sore throat x2 days. Woke up this am with drainage and "crusty" eyes  ? ?Pt reports his son is also sick  ?

## 2022-03-24 ENCOUNTER — Other Ambulatory Visit: Payer: Self-pay

## 2022-04-13 ENCOUNTER — Encounter: Payer: Self-pay | Admitting: Neurology

## 2022-04-28 ENCOUNTER — Other Ambulatory Visit: Payer: Self-pay

## 2022-05-01 ENCOUNTER — Other Ambulatory Visit: Payer: Self-pay

## 2022-05-25 ENCOUNTER — Emergency Department (HOSPITAL_BASED_OUTPATIENT_CLINIC_OR_DEPARTMENT_OTHER)
Admission: EM | Admit: 2022-05-25 | Discharge: 2022-05-25 | Disposition: A | Discharge status: Home / Self Care | Payer: Self-pay | Attending: Emergency Medicine | Admitting: Emergency Medicine

## 2022-05-25 ENCOUNTER — Other Ambulatory Visit: Payer: Self-pay

## 2022-05-25 ENCOUNTER — Emergency Department (HOSPITAL_BASED_OUTPATIENT_CLINIC_OR_DEPARTMENT_OTHER): Payer: Self-pay | Admitting: Radiology

## 2022-05-25 ENCOUNTER — Encounter (HOSPITAL_BASED_OUTPATIENT_CLINIC_OR_DEPARTMENT_OTHER): Payer: Self-pay | Admitting: Emergency Medicine

## 2022-05-25 DIAGNOSIS — R0602 Shortness of breath: Secondary | ICD-10-CM | POA: Insufficient documentation

## 2022-05-25 DIAGNOSIS — R5383 Other fatigue: Secondary | ICD-10-CM | POA: Insufficient documentation

## 2022-05-25 DIAGNOSIS — Z20822 Contact with and (suspected) exposure to covid-19: Secondary | ICD-10-CM | POA: Insufficient documentation

## 2022-05-25 LAB — BASIC METABOLIC PANEL
Anion gap: 9 (ref 5–15)
BUN: 19 mg/dL (ref 6–20)
CO2: 27 mmol/L (ref 22–32)
Calcium: 9.1 mg/dL (ref 8.9–10.3)
Chloride: 103 mmol/L (ref 98–111)
Creatinine, Ser: 0.86 mg/dL (ref 0.61–1.24)
GFR, Estimated: >60 mL/min (ref >60)
Glucose, Bld: 93 mg/dL (ref 70–99)
Potassium: 4 mmol/L (ref 3.5–5.1)
Sodium: 139 mmol/L (ref 135–145)

## 2022-05-25 LAB — CBC WITH DIFFERENTIAL/PLATELET
Abs Immature Granulocytes: 0.02 10*3/uL (ref 0.00–0.07)
Basophils Absolute: 0 10*3/uL (ref 0.0–0.1)
Basophils Relative: 1 %
Eosinophils Absolute: 0.1 10*3/uL (ref 0.0–0.5)
Eosinophils Relative: 3 %
HCT: 41 % (ref 39.0–52.0)
Hemoglobin: 13.7 g/dL (ref 13.0–17.0)
Immature Granulocytes: 1 %
Lymphocytes Relative: 45 %
Lymphs Abs: 2 10*3/uL (ref 0.7–4.0)
MCH: 31.6 pg (ref 26.0–34.0)
MCHC: 33.4 g/dL (ref 30.0–36.0)
MCV: 94.5 fL (ref 80.0–100.0)
Monocytes Absolute: 0.6 10*3/uL (ref 0.1–1.0)
Monocytes Relative: 15 %
Neutro Abs: 1.5 10*3/uL — ABNORMAL LOW (ref 1.7–7.7)
Neutrophils Relative %: 35 %
Platelets: 173 10*3/uL (ref 150–400)
RBC: 4.34 MIL/uL (ref 4.22–5.81)
RDW: 14.5 % (ref 11.5–15.5)
WBC: 4.2 10*3/uL (ref 4.0–10.5)
nRBC: 0 % (ref 0.0–0.2)

## 2022-05-25 LAB — RESP PANEL BY RT-PCR (FLU A&B, COVID) ARPGX2
Influenza A by PCR: NEGATIVE
Influenza B by PCR: NEGATIVE
SARS Coronavirus 2 by RT PCR: NEGATIVE

## 2022-05-25 LAB — MONONUCLEOSIS SCREEN: Mono Screen: NEGATIVE

## 2022-05-25 LAB — TSH: TSH: 2.043 u[IU]/mL (ref 0.350–4.500)

## 2022-05-25 NOTE — ED Triage Notes (Signed)
Pt arrives to ED with c/o fatigue and shortness of breath. These symptoms started x4 days ago. He reports the SOB has slightly improved however the fatigue has become extreme. Associated symptoms include headache.

## 2022-05-25 NOTE — ED Provider Notes (Signed)
[facility] EMERGENCY DEPT Provider Note   CSN: [id number] Arrival date & time: 05/25/22  7616     History  Chief Complaint  Patient presents with   Fatigue    Donald Travis is a 36 y.o. male.  Patient is a 36 year old male with no significant past medical history presenting for fatigue and shortness of breath.  Admits to worsening fatigue over the last 4 days, shortness of breath with no exacerbating or alleviating symptoms.  Denies any history of DVT, PE, lower extremity swelling, calf pain, recent prolonged travel, recent surgical procedures, falls, injuries, or wounds.  Denies hormone therapy use.  Nuys fevers, chills, coughing.  Denies nausea, vomiting, diarrhea.  Denies black or bloody stools.  Denies history of heart arrhythmias.  States he sleeps approximately 4 hours per night due to difficulty falling asleep.  Use cigarettes over the last 20 years however stopped 1 month ago and is now vaping.  The history is provided by the patient. No language interpreter was used.       Home Medications Prior to Admission medications   Medication Sig Start Date End Date Taking? Authorizing Provider  acetaminophen (TYLENOL) 325 MG tablet Take 650 mg by mouth every 6 (six) hours as needed.    [provider]  azithromycin (ZITHROMAX) 250 MG tablet Take 2 tablets by mouth today, then 1 tablet daily for the next 4 days. 01/26/22     benzonatate (TESSALON) 100 MG capsule Take 1 capsule by mouth every 8 hours as needed for cough. 01/26/22     divalproex (DEPAKOTE ER) 500 MG 24 hr tablet Take 4 tablets daily 07/25/21   Van Clines, MD  ibuprofen (ADVIL,MOTRIN) 600 MG tablet Take 1 tablet (600 mg total) by mouth every 6 (six) hours as needed. 01/10/17   Fayrene Helper, PA-C      Allergies    Tegretol [carbamazepine]    Review of Systems   Review of Systems  Constitutional:  Positive for fatigue. Negative for chills and fever.  HENT:  Negative for ear pain and sore  throat.   Eyes:  Negative for pain and visual disturbance.  Respiratory:  Positive for shortness of breath. Negative for cough.   Cardiovascular:  Negative for chest pain and palpitations.  Gastrointestinal:  Negative for abdominal pain and vomiting.  Genitourinary:  Negative for dysuria and hematuria.  Musculoskeletal:  Negative for arthralgias and back pain.  Skin:  Negative for color change and rash.  Neurological:  Negative for seizures and syncope.  All other systems reviewed and are negative.   Physical Exam Updated Vital Signs BP (!) 108/97   Pulse 67   Temp 98.4 F (36.9 C) (Oral)   Resp 16   Ht 5\' 9"  (1.753 m)   Wt 81.6 kg   SpO2 96%   BMI 26.58 kg/m  Physical Exam Vitals and nursing note reviewed.  Constitutional:      General: He is not in acute distress.    Appearance: He is well-developed.  HENT:     Head: Normocephalic and atraumatic.  Eyes:     Conjunctiva/sclera: Conjunctivae normal.  Cardiovascular:     Rate and Rhythm: Normal rate and regular rhythm.     Heart sounds: No murmur heard. Pulmonary:     Effort: Pulmonary effort is normal. No respiratory distress.     Breath sounds: Normal breath sounds.  Abdominal:     Palpations: Abdomen is soft.     Tenderness: There is no abdominal tenderness.  Musculoskeletal:  General: No swelling.     Cervical back: Neck supple.  Skin:    General: Skin is warm and dry.     Capillary Refill: Capillary refill takes less than 2 seconds.  Neurological:     Mental Status: He is alert.  Psychiatric:        Mood and Affect: Mood normal.     ED Results / Procedures / Treatments   Labs (all labs ordered are listed, but only abnormal results are displayed) Labs Reviewed  CBC WITH DIFFERENTIAL/PLATELET - Abnormal; Notable for the following components:      Result Value   Neutro Abs 1.5 (*)    All other components within normal limits  RESP PANEL BY RT-PCR (FLU A&B, COVID) ARPGX2  MONONUCLEOSIS SCREEN   TSH  BASIC METABOLIC PANEL    EKG EKG Interpretation  Date/Time:  Thursday May 25 2022 08:42:04 EDT Ventricular Rate:  73 PR Interval:  155 QRS Duration: 85 QT Interval:  363 QTC Calculation: 400 R Axis:   52 Text Interpretation: Sinus rhythm Confirmed by Edwin Dada ([id number]) on 05/25/2022 9:42:51 AM  Radiology DG Chest 2 View  Result Date: 05/25/2022 CLINICAL DATA:  Shortness of breath and fatigue. EXAM: CHEST - 2 VIEW COMPARISON:  Chest two views 05/10/2016 FINDINGS: Cardiac silhouette and mediastinal contours are within normal limits. The lungs are clear. No pleural effusion or pneumothorax. No acute skeletal abnormality. IMPRESSION: No active cardiopulmonary disease. Electronically Signed   By: Neita Garnet M.D.   On: 05/25/2022 08:59    Procedures Procedures    Medications Ordered in ED Medications - No data to display  ED Course/ Medical Decision Making/ A&P                           Medical Decision Making Amount and/or Complexity of Data Reviewed Labs: ordered. Radiology: ordered.   43:61 AM 36 year old male with no significant past medical history presenting for fatigue and shortness of breath.  Is a well-appearing, afebrile, stable vital signs.  Physical exam demonstrates no tachypnea, tachycardia, or signs of respiratory distress.  Patient has equal bilateral breath sounds with no adventitious lung sounds.  No anemia No hypoxia No pneumothorax No pneumonia Stable electrolytes No mono, COVID, influenza Stable ECG with no cardiac arrhythmias.  Patient in no distress and overall condition improved here in the ED. Detailed discussions were had with the patient regarding current findings, and need for close f/u with PCP or on call doctor. The patient has been instructed to return immediately if the symptoms worsen in any way for re-evaluation. Patient verbalized understanding and is in agreement with current care plan. All questions answered prior to  discharge.         Final Clinical Impression(s) / ED Diagnoses Final diagnoses:  Other fatigue  SOB (shortness of breath)    Rx / DC Orders ED Discharge Orders     None         Franne Forts, DO 05/25/22 1054

## 2022-05-25 NOTE — Discharge Instructions (Signed)
Try to get more sleep :)  Follow up with your primary care if symptoms of fatigue worsen for re-evaluation.

## 2022-05-25 NOTE — ED Notes (Signed)
Discharge paperwork given and understood. 

## 2022-06-06 ENCOUNTER — Emergency Department (HOSPITAL_BASED_OUTPATIENT_CLINIC_OR_DEPARTMENT_OTHER)
Admission: EM | Admit: 2022-06-06 | Discharge: 2022-06-06 | Disposition: A | Discharge status: Home / Self Care | Payer: Self-pay | Attending: Emergency Medicine | Admitting: Emergency Medicine

## 2022-06-06 ENCOUNTER — Other Ambulatory Visit: Payer: Self-pay

## 2022-06-06 DIAGNOSIS — M109 Gout, unspecified: Secondary | ICD-10-CM

## 2022-06-06 DIAGNOSIS — M79675 Pain in left toe(s): Secondary | ICD-10-CM | POA: Insufficient documentation

## 2022-06-06 MED ORDER — PREDNISONE 10 MG PO TABS
20.0000 mg | ORAL_TABLET | Freq: Two times a day (BID) | ORAL | 0 refills | Status: DC
  Filled 2022-06-06: qty 20, 5d supply, fill #0

## 2022-06-06 MED ORDER — INDOMETHACIN 25 MG PO CAPS
25.0000 mg | ORAL_CAPSULE | Freq: Three times a day (TID) | ORAL | 0 refills | Status: DC
  Filled 2022-06-06: qty 20, 7d supply, fill #0

## 2022-06-06 MED ORDER — PREDNISONE 20 MG PO TABS
40.0000 mg | ORAL_TABLET | Freq: Once | ORAL | Status: AC
  Administered 2022-06-06: 40 mg via ORAL
  Filled 2022-06-06: qty 2

## 2022-06-06 MED ORDER — HYDROCODONE-ACETAMINOPHEN 5-325 MG PO TABS
1.0000 | ORAL_TABLET | Freq: Four times a day (QID) | ORAL | 0 refills | Status: DC | PRN
  Filled 2022-06-06: qty 15, 2d supply, fill #0

## 2022-06-06 NOTE — ED Triage Notes (Signed)
POV, pt c/o left foot pain, sts it started yesterday and tried to walk it off but cont to get worse. Denies injury or trauma, strong pedal pulse noted. Pt amb to room, A&O x 4.

## 2022-06-06 NOTE — ED Provider Notes (Addendum)
  [facility]GENCY DEPT Provider Note   CSN: [id number] Arrival date & time: 06/06/22  0541     History  Chief Complaint  Patient presents with   Foot Pain    Donald Travis is a 36 y.o. male.  Patient is a 36 year old male presenting with complaints of left great toe pain.  This started 2 days ago in the absence of any injury or trauma.  He describes severe pain when he attempts to move or ambulate.  He denies fevers or chills.  He denies any alleviating factors.  The history is provided by the patient.       Home Medications Prior to Admission medications   Medication Sig Start Date End Date Taking? Authorizing Provider  acetaminophen (TYLENOL) 325 MG tablet Take 650 mg by mouth every 6 (six) hours as needed.    [provider]  azithromycin (ZITHROMAX) 250 MG tablet Take 2 tablets by mouth today, then 1 tablet daily for the next 4 days. 01/26/22     benzonatate (TESSALON) 100 MG capsule Take 1 capsule by mouth every 8 hours as needed for cough. 01/26/22     divalproex (DEPAKOTE ER) 500 MG 24 hr tablet Take 4 tablets daily 07/25/21   Van Clines, MD  ibuprofen (ADVIL,MOTRIN) 600 MG tablet Take 1 tablet (600 mg total) by mouth every 6 (six) hours as needed. 01/10/17   Fayrene Helper, PA-C      Allergies    Tegretol [carbamazepine]    Review of Systems   Review of Systems  All other systems reviewed and are negative.   Physical Exam Updated Vital Signs BP (!) 157/96   Pulse 68   Temp 97.9 F (36.6 C) (Oral)   Resp 19   Ht 5\' 9"  (1.753 m)   Wt 81.6 kg   SpO2 98%   BMI 26.57 kg/m  Physical Exam Vitals and nursing note reviewed.  Constitutional:      General: He is not in acute distress.    Appearance: Normal appearance. He is not ill-appearing.  HENT:     Head: Normocephalic and atraumatic.  Pulmonary:     Effort: Pulmonary effort is normal.  Musculoskeletal:     Comments: There is tenderness to palpation in the left  metatarsophalangeal joint.  He has severe pain with any range of motion.  There is some overlying warmth and erythema.  DP pulses are easily palpable and motor and sensation are intact throughout the entire foot.  Skin:    General: Skin is warm and dry.  Neurological:     Mental Status: He is alert.     ED Results / Procedures / Treatments   Labs (all labs ordered are listed, but only abnormal results are displayed) Labs Reviewed - No data to display  EKG None  Radiology No results found.  Procedures Procedures    Medications Ordered in ED Medications - No data to display  ED Course/ Medical Decision Making/ A&P  Presentation and exam most consistent with gout.  This will be treated with prednisone and pain medicine.  To follow-up as needed if not improving.  Final Clinical Impression(s) / ED Diagnoses Final diagnoses:  None    Rx / DC Orders ED Discharge Orders     None         , MD 06/06/22 08/06/22    [id number], MD 06/06/22 [phone]

## 2022-06-06 NOTE — Discharge Instructions (Addendum)
Begin taking prednisone as prescribed. ? ?Begin taking hydrocodone as prescribed as needed for pain. ? ?Follow-up with primary doctor if not improving in the next 3 to 4 days. ?

## 2022-06-14 ENCOUNTER — Ambulatory Visit (INDEPENDENT_AMBULATORY_CARE_PROVIDER_SITE_OTHER): Payer: Self-pay | Admitting: Physician Assistant

## 2022-06-14 ENCOUNTER — Other Ambulatory Visit: Payer: Self-pay

## 2022-06-14 ENCOUNTER — Encounter: Payer: Self-pay | Admitting: Physician Assistant

## 2022-06-14 VITALS — BP 130/78 | HR 75 | Resp 18 | Ht 69.0 in | Wt 164.0 lb

## 2022-06-14 DIAGNOSIS — M109 Gout, unspecified: Secondary | ICD-10-CM

## 2022-06-14 DIAGNOSIS — B350 Tinea barbae and tinea capitis: Secondary | ICD-10-CM

## 2022-06-14 DIAGNOSIS — G4700 Insomnia, unspecified: Secondary | ICD-10-CM

## 2022-06-14 MED ORDER — KETOCONAZOLE 2 % EX CREA
1.0000 | TOPICAL_CREAM | Freq: Every day | CUTANEOUS | 0 refills | Status: DC
  Filled 2022-06-14 – 2022-07-07 (×2): qty 15, 7d supply, fill #0

## 2022-06-14 NOTE — Patient Instructions (Addendum)
To help with your sleep, I encourage you to try melatonin.  You will purchase this over the counter.  You can start at 5mg  and increase to 10 mg if needed.  To help with your patch on your scalp, you will use ketoconazole once daily until resolved.  Please let know if there is anything else we can do for you,  [place], PA-C Physician Assistant [facility]    Insomnia Insomnia is a sleep disorder that makes it difficult to fall asleep or stay asleep. Insomnia can cause fatigue, low energy, difficulty concentrating, mood swings, and poor performance at work or school. There are three different ways to classify insomnia: Difficulty falling asleep. Difficulty staying asleep. Waking up too early in the morning. Any type of insomnia can be long-term (chronic) or short-term (acute). Both are common. Short-term insomnia usually lasts for 3 months or less. Chronic insomnia occurs at least three times a week for longer than 3 months. What are the causes? Insomnia may be caused by another condition, situation, or substance, such as: Having certain mental health conditions, such as anxiety and depression. Using caffeine, alcohol, tobacco, or drugs. Having gastrointestinal conditions, such as gastroesophageal reflux disease (GERD). Having certain medical conditions. These include: Asthma. Alzheimer's disease. Stroke. Chronic pain. An overactive thyroid gland (hyperthyroidism). Other sleep disorders, such as restless legs syndrome and sleep apnea. Menopause. Sometimes, the cause of insomnia may not be known. What increases the risk? Risk factors for insomnia include: Gender. Females are affected more often than males. Age. Insomnia is more common as people get older. Stress and certain medical and mental health conditions. Lack of exercise. Having an irregular work schedule. This may include working night  shifts and traveling between different time zones. What are the signs or symptoms? If you have insomnia, the main symptom is having trouble falling asleep or having trouble staying asleep. This may lead to other symptoms, such as: Feeling tired or having low energy. Feeling nervous about going to sleep. Not feeling rested in the morning. Having trouble concentrating. Feeling irritable, anxious, or depressed. How is this diagnosed? This condition may be diagnosed based on: Your symptoms and medical history. Your health care provider may ask about: Your sleep habits. Any medical conditions you have. Your mental health. A physical exam. How is this treated? Treatment for insomnia depends on the cause. Treatment may focus on treating an underlying condition that is causing the insomnia. Treatment may also include: Medicines to help you sleep. Counseling or therapy. Lifestyle adjustments to help you sleep better. Follow these instructions at home: Eating and drinking  Limit or avoid alcohol, caffeinated beverages, and products that contain nicotine and tobacco, especially close to bedtime. These can disrupt your sleep. Do not eat a large meal or eat spicy foods right before bedtime. This can lead to digestive discomfort that can make it hard for you to sleep. Sleep habits  Keep a sleep diary to help you and your health care provider figure out what could be causing your insomnia. Write down: When you sleep. When you wake up during the night. How well you sleep and how rested you feel the next day. Any side effects of medicines you are taking. What you eat and drink. Make your bedroom a dark, comfortable place where it is easy to fall asleep. Put up shades or blackout curtains to block light from outside. Use a white noise machine to block noise. Keep the temperature cool. Limit screen use  before bedtime. This includes: Not watching TV. Not using your smartphone, tablet, or  computer. Stick to a routine that includes going to bed and waking up at the same times every day and night. This can help you fall asleep faster. Consider making a quiet activity, such as reading, part of your nighttime routine. Try to avoid taking naps during the day so that you sleep better at night. Get out of bed if you are still awake after 15 minutes of trying to sleep. Keep the lights down, but try reading or doing a quiet activity. When you feel sleepy, go back to bed. General instructions Take over-the-counter and prescription medicines only as told by your health care provider. Exercise regularly as told by your health care provider. However, avoid exercising in the hours right before bedtime. Use relaxation techniques to manage stress. Ask your health care provider to suggest some techniques that may work well for you. These may include: Breathing exercises. Routines to release muscle tension. Visualizing peaceful scenes. Make sure that you drive carefully. Do not drive if you feel very sleepy. Keep all follow-up visits. This is important. Contact a health care provider if: You are tired throughout the day. You have trouble in your daily routine due to sleepiness. You continue to have sleep problems, or your sleep problems get worse. Get help right away if: You have thoughts about hurting yourself or someone else. Get help right away if you feel like you may hurt yourself or others, or have thoughts about taking your own life. Go to your nearest emergency room or: Call 911. Call the National Suicide Prevention Lifeline at 8050279705 or 988. This is open 24 hours a day. Text the Crisis Text Line at 408-600-5976. Summary Insomnia is a sleep disorder that makes it difficult to fall asleep or stay asleep. Insomnia can be long-term (chronic) or short-term (acute). Treatment for insomnia depends on the cause. Treatment may focus on treating an underlying condition that is causing the  insomnia. Keep a sleep diary to help you and your health care provider figure out what could be causing your insomnia. This information is not intended to replace advice given to you by your health care provider. Make sure you discuss any questions you have with your health care provider. Document Revised: 10/03/2021 Document Reviewed: 10/03/2021 Elsevier Patient Education  2023 [employer].

## 2022-06-14 NOTE — Progress Notes (Unsigned)
   New Patient Office Visit  Subjective    Patient ID: Donald Travis, male    DOB: [birth year]  Age: 36 y.o. MRN: [id number]  CC: No chief complaint on file.   HPI Donald Travis presents to establish care ***  Was seen in the ED Walking without crutches for 3 days - no pain medicines  Hard time falling asleep - 3 hours to fall asleep mind racing 5 hours  Outpatient Encounter Medications as of 06/14/2022  Medication Sig   acetaminophen (TYLENOL) 325 MG tablet Take 650 mg by mouth every 6 (six) hours as needed.   azithromycin (ZITHROMAX) 250 MG tablet Take 2 tablets by mouth today, then 1 tablet daily for the next 4 days.   benzonatate (TESSALON) 100 MG capsule Take 1 capsule by mouth every 8 hours as needed for cough.   divalproex (DEPAKOTE ER) 500 MG 24 hr tablet Take 4 tablets daily   HYDROcodone-acetaminophen (NORCO) 5-325 MG tablet Take 1-2 tablets by mouth every 6 (six) hours as needed.   ibuprofen (ADVIL,MOTRIN) 600 MG tablet Take 1 tablet (600 mg total) by mouth every 6 (six) hours as needed.   predniSONE (DELTASONE) 10 MG tablet Take 2 tablets (20 mg total) by mouth 2 (two) times daily with a meal.   No facility-administered encounter medications on file as of 06/14/2022.    Past Medical History:  Diagnosis Date   Asthma    in childhood, no asthma problems since age 68    Epilepsy (HCC) 1993   Seizure (HCC)     Past Surgical History:  Procedure Laterality Date   TONSILLECTOMY  ?    as a childhood     Family History  Adopted: Yes  Family history unknown: Yes    Social History   Socioeconomic History   Marital status: Single    Spouse name: Not on file   Number of children: 1    Years of education: some colle   Highest education level: Not on file  Occupational History    Employer: [employer]  Tobacco Use   Smoking status: Some Days    Packs/day: 0.25    Years: 5.00    Total pack years: 1.25    Types: Cigarettes   Smokeless tobacco: Never  Vaping Use    Vaping Use: Never used  Substance and Sexual Activity   Alcohol use: Yes    Alcohol/week: 0.0 standard drinks of alcohol    Comment: socially, evevey couple of weekes    Drug use: No   Sexual activity: Yes    Birth control/protection: None  Other Topics Concern   Not on file  Social History Narrative    Pt lives in 2 story home with girlfriend and roommate   Has 1 daughter   Some college education   Works as a DJ   Right handed    Social Determinants of Corporate investment banker Strain: Not on file  Food Insecurity: Not on file  Transportation Needs: Not on file  Physical Activity: Not on file  Stress: Not on file  Social Connections: Not on file  Intimate Partner Violence: Not on file    ROS      Objective    There were no vitals taken for this visit.  Physical Exam  {Labs (Optional):23779}    Assessment & Plan:   Problem List Items Addressed This Visit   None   No follow-ups on file.   Donald Knudsen Mayers, PA-C

## 2022-06-20 ENCOUNTER — Other Ambulatory Visit: Payer: Self-pay

## 2022-07-07 ENCOUNTER — Ambulatory Visit: Payer: Self-pay | Attending: Nurse Practitioner | Admitting: Nurse Practitioner

## 2022-07-07 ENCOUNTER — Encounter: Payer: Self-pay | Admitting: Nurse Practitioner

## 2022-07-07 ENCOUNTER — Other Ambulatory Visit: Payer: Self-pay

## 2022-07-07 VITALS — BP 113/70 | HR 71 | Temp 98.1°F | Ht 69.0 in | Wt 169.6 lb

## 2022-07-07 DIAGNOSIS — M10072 Idiopathic gout, left ankle and foot: Secondary | ICD-10-CM

## 2022-07-07 NOTE — Progress Notes (Signed)
Assessment & Plan:  Donald Travis was seen today for gout.  Diagnoses and all orders for this visit:  Acute idiopathic gout of left foot -     Uric Acid    Patient has been counseled on age-appropriate routine health concerns for screening and prevention. These are reviewed and up-to-date. Referrals have been placed accordingly. Immunizations are up-to-date or declined.    Subjective:   Chief Complaint  Patient presents with   Gout   HPI Donald Travis 36 y.o. male presents to office today for follow up to GOUT.   He was treated for left foot gout last month however there is no imaging or lab work on file for confirmation of actual gout. He reports little to no pain today after completing prednisone and pain medication in august. Will obtain uric acid level today as he states pain has not completely resolved.     Review of Systems  Constitutional:  Negative for fever, malaise/fatigue and weight loss.  HENT: Negative.  Negative for nosebleeds.   Eyes: Negative.  Negative for blurred vision, double vision and photophobia.  Respiratory: Negative.  Negative for cough and shortness of breath.   Cardiovascular: Negative.  Negative for chest pain, palpitations and leg swelling.  Gastrointestinal: Negative.  Negative for heartburn, nausea and vomiting.  Musculoskeletal:  Positive for joint pain. Negative for myalgias.  Neurological: Negative.  Negative for dizziness, focal weakness, seizures and headaches.  Psychiatric/Behavioral: Negative.  Negative for suicidal ideas.     Past Medical History:  Diagnosis Date   Asthma    in childhood, no asthma problems since age 25    Epilepsy (HCC) 1993   Seizure (HCC)     Past Surgical History:  Procedure Laterality Date   TONSILLECTOMY  ?    as a childhood     Family History  Adopted: Yes  Family history unknown: Yes    Social History Reviewed with no changes to be made today.   Outpatient Medications Prior to Visit  Medication Sig  Dispense Refill   acetaminophen (TYLENOL) 325 MG tablet Take 650 mg by mouth every 6 (six) hours as needed.     divalproex (DEPAKOTE ER) 500 MG 24 hr tablet Take 4 tablets daily 120 tablet 11   HYDROcodone-acetaminophen (NORCO) 5-325 MG tablet Take 1-2 tablets by mouth every 6 (six) hours as needed. 15 tablet 0   ibuprofen (ADVIL,MOTRIN) 600 MG tablet Take 1 tablet (600 mg total) by mouth every 6 (six) hours as needed. 30 tablet 0   ketoconazole (NIZORAL) 2 % cream Apply 1 Application topically daily. 15 g 0   predniSONE (DELTASONE) 10 MG tablet Take 2 tablets (20 mg total) by mouth 2 (two) times daily with a meal. 20 tablet 0   azithromycin (ZITHROMAX) 250 MG tablet Take 2 tablets by mouth today, then 1 tablet daily for the next 4 days. (Patient not taking: Reported on 06/14/2022) 6 tablet 0   benzonatate (TESSALON) 100 MG capsule Take 1 capsule by mouth every 8 hours as needed for cough. (Patient not taking: Reported on 06/14/2022) 15 capsule 0   No facility-administered medications prior to visit.    Allergies  Allergen Reactions   Tegretol [Carbamazepine] Other (See Comments)    Excessive weight gain       Objective:    BP 113/70   Pulse 71   Temp 98.1 F (36.7 C) (Oral)   Ht 5\' 9"  (1.753 m)   Wt 169 lb 9.6 oz (76.9 kg)   SpO2  97%   BMI 25.05 kg/m  Wt Readings from Last 3 Encounters:  07/07/22 169 lb 9.6 oz (76.9 kg)  06/14/22 164 lb (74.4 kg)  06/06/22 179 lb 14.3 oz (81.6 kg)    Physical Exam Vitals and nursing note reviewed.  Constitutional:      Appearance: He is well-developed.  HENT:     Head: Normocephalic and atraumatic.  Cardiovascular:     Rate and Rhythm: Normal rate and regular rhythm.     Heart sounds: Normal heart sounds. No murmur heard.    No friction rub. No gallop.  Pulmonary:     Effort: Pulmonary effort is normal. No tachypnea or respiratory distress.     Breath sounds: Normal breath sounds. No decreased breath sounds, wheezing, rhonchi or rales.   Chest:     Chest wall: No tenderness.  Abdominal:     General: Bowel sounds are normal.     Palpations: Abdomen is soft.  Musculoskeletal:        General: Normal range of motion.     Cervical back: Normal range of motion.  Skin:    General: Skin is warm and dry.  Neurological:     Mental Status: He is alert and oriented to person, place, and time.     Coordination: Coordination normal.  Psychiatric:        Behavior: Behavior normal. Behavior is cooperative.        Thought Content: Thought content normal.        Judgment: Judgment normal.          Patient has been counseled extensively about nutrition and exercise as well as the importance of adherence with medications and regular follow-up. The patient was given clear instructions to go to ER or return to medical center if symptoms don't improve, worsen or new problems develop. The patient verbalized understanding.   Follow-up: Return if symptoms worsen or fail to improve.   Claiborne Rigg, FNP-BC [facility] [place], [place] [phone]   07/07/2022, 3:06 PM

## 2022-07-08 LAB — URIC ACID: Uric Acid: 6.5 mg/dL (ref 3.8–8.4)

## 2022-07-20 ENCOUNTER — Other Ambulatory Visit: Payer: Self-pay

## 2022-07-25 ENCOUNTER — Ambulatory Visit: Payer: Self-pay | Admitting: Neurology

## 2022-08-04 ENCOUNTER — Ambulatory Visit: Payer: Self-pay | Admitting: Neurology

## 2022-08-09 ENCOUNTER — Other Ambulatory Visit: Payer: Self-pay

## 2022-08-09 ENCOUNTER — Other Ambulatory Visit: Payer: Self-pay | Admitting: Neurology

## 2022-08-09 DIAGNOSIS — G40309 Generalized idiopathic epilepsy and epileptic syndromes, not intractable, without status epilepticus: Secondary | ICD-10-CM

## 2022-08-09 MED ORDER — DIVALPROEX SODIUM ER 500 MG PO TB24
2000.0000 mg | ORAL_TABLET | Freq: Every day | ORAL | 0 refills | Status: DC
  Filled 2022-08-09: qty 87, 21d supply, fill #0
  Filled 2022-08-09: qty 33, 9d supply, fill #0
  Filled 2022-08-09: qty 120, fill #0
  Filled ????-??-??: fill #0

## 2022-08-09 NOTE — Telephone Encounter (Signed)
Pt called one refill sent in and that he needs to make an appointment so we will know how far to send refills

## 2022-08-10 ENCOUNTER — Other Ambulatory Visit: Payer: Self-pay

## 2022-09-29 ENCOUNTER — Other Ambulatory Visit: Payer: Self-pay | Admitting: Neurology

## 2022-09-29 DIAGNOSIS — G40309 Generalized idiopathic epilepsy and epileptic syndromes, not intractable, without status epilepticus: Secondary | ICD-10-CM

## 2022-10-02 ENCOUNTER — Telehealth: Payer: Self-pay | Admitting: Neurology

## 2022-10-02 ENCOUNTER — Other Ambulatory Visit: Payer: Self-pay | Admitting: Neurology

## 2022-10-02 ENCOUNTER — Other Ambulatory Visit: Payer: Self-pay | Admitting: Nurse Practitioner

## 2022-10-02 ENCOUNTER — Other Ambulatory Visit: Payer: Self-pay

## 2022-10-02 DIAGNOSIS — G40309 Generalized idiopathic epilepsy and epileptic syndromes, not intractable, without status epilepticus: Secondary | ICD-10-CM

## 2022-10-02 MED ORDER — DIVALPROEX SODIUM ER 500 MG PO TB24
2000.0000 mg | ORAL_TABLET | Freq: Every day | ORAL | 5 refills | Status: DC
  Filled 2022-10-02: qty 120, 30d supply, fill #0
  Filled 2022-11-02: qty 120, 30d supply, fill #1
  Filled 2022-12-06: qty 120, 30d supply, fill #2
  Filled 2023-01-16 (×2): qty 120, 30d supply, fill #3
  Filled 2023-02-22: qty 120, 30d supply, fill #4
  Filled 2023-03-27: qty 120, 30d supply, fill #5

## 2022-10-02 NOTE — Telephone Encounter (Signed)
Pt called informed that we have sent in his refills and that he must keep his next appointment for any more refills,

## 2022-10-02 NOTE — Telephone Encounter (Signed)
Pt needs a refill on his divalproex ER 500mg  24 hr tablet. He made an appt and knows he has to keep this appt.  Pharmacy- [facility]

## 2022-10-02 NOTE — Addendum Note (Signed)
Addended by: Dimas Chyle on: 10/02/2022 03:10 PM   Modules accepted: Orders

## 2022-10-02 NOTE — Telephone Encounter (Signed)
Ok to send refills until his appt, pls let him know our office policy that if he misses next appointment, we can no longer send in refills, thanks

## 2022-11-02 ENCOUNTER — Other Ambulatory Visit: Payer: Self-pay

## 2022-11-03 ENCOUNTER — Other Ambulatory Visit: Payer: Self-pay

## 2022-12-06 ENCOUNTER — Other Ambulatory Visit: Payer: Self-pay

## 2022-12-08 ENCOUNTER — Other Ambulatory Visit: Payer: Self-pay

## 2023-01-15 ENCOUNTER — Other Ambulatory Visit: Payer: Self-pay

## 2023-01-15 MED ORDER — CHLORHEXIDINE GLUCONATE 0.12 % MT SOLN
OROMUCOSAL | 0 refills | Status: DC
  Filled 2023-01-15: qty 473, 10d supply, fill #0

## 2023-01-15 MED ORDER — AMOXICILLIN 500 MG PO CAPS
500.0000 mg | ORAL_CAPSULE | Freq: Three times a day (TID) | ORAL | 0 refills | Status: DC
  Filled 2023-01-15: qty 21, 7d supply, fill #0

## 2023-01-15 MED ORDER — IBUPROFEN 800 MG PO TABS
800.0000 mg | ORAL_TABLET | Freq: Three times a day (TID) | ORAL | 0 refills | Status: DC | PRN
  Filled 2023-01-15: qty 20, 7d supply, fill #0

## 2023-01-16 ENCOUNTER — Other Ambulatory Visit: Payer: Self-pay

## 2023-02-22 ENCOUNTER — Other Ambulatory Visit: Payer: Self-pay

## 2023-02-23 ENCOUNTER — Other Ambulatory Visit: Payer: Self-pay

## 2023-03-07 HISTORY — PX: OTHER SURGICAL HISTORY: SHX169

## 2023-03-27 ENCOUNTER — Other Ambulatory Visit: Payer: Self-pay

## 2023-03-29 ENCOUNTER — Other Ambulatory Visit: Payer: Self-pay

## 2023-04-18 ENCOUNTER — Encounter: Payer: Self-pay | Admitting: Neurology

## 2023-04-18 ENCOUNTER — Ambulatory Visit (INDEPENDENT_AMBULATORY_CARE_PROVIDER_SITE_OTHER): Payer: Self-pay | Admitting: Neurology

## 2023-04-18 ENCOUNTER — Other Ambulatory Visit: Payer: Self-pay

## 2023-04-18 DIAGNOSIS — G40309 Generalized idiopathic epilepsy and epileptic syndromes, not intractable, without status epilepticus: Secondary | ICD-10-CM

## 2023-04-18 MED ORDER — DIVALPROEX SODIUM ER 500 MG PO TB24
2000.0000 mg | ORAL_TABLET | Freq: Every day | ORAL | 11 refills | Status: DC
  Filled 2023-04-18 – 2023-04-30 (×2): qty 120, 30d supply, fill #0
  Filled 2023-06-13: qty 120, 30d supply, fill #1
  Filled 2023-07-23: qty 120, 30d supply, fill #2
  Filled 2023-09-10: qty 120, 30d supply, fill #3
  Filled 2023-10-24: qty 120, 30d supply, fill #4
  Filled 2023-12-07: qty 120, 30d supply, fill #5
  Filled 2024-01-17: qty 120, 30d supply, fill #6
  Filled 2024-02-21: qty 120, 30d supply, fill #7
  Filled 2024-04-03: qty 120, 30d supply, fill #8

## 2023-04-18 NOTE — Progress Notes (Signed)
NEUROLOGY FOLLOW UP OFFICE NOTE  Donald Travis [id number] 1986/04/01  HISTORY OF PRESENT ILLNESS: I had the pleasure of seeing Donald Travis in follow-up in the neurology clinic on 04/18/2023.  The patient was last seen almost 2 years ago for well-controlled primary generalized epilepsy. He is alone in the office today. He denies any convulsions since 09/2018 on Depakote ER 500mg  4 tablets every morning (2000mg  daily), no side effects. He has had a few brief jerks/twitches when sleep deprived. No staring/unresponsive episodes, gaps in time, olfactory/gustatory hallucinations, focal numbness/tingling/weakness. No headaches, dizziness, vision changes, no falls. He get 6-8 hours of sleep. Mood is fine. He lives with his parents and 3yo daughter. 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    History on Initial Assessment 03/11/2018: This is a pleasant 37 year old right-handed man with a history of seizures since the 3rd grade, presenting to establish care. He reports hitting his head on the gym floor in the 3rd grade, and seizures starting since then. He was mostly having generalized convulsions and recalls trying different medications until he was started on Depakote in high school with good response. He recalls taking Tegretol at some point, does not recall the other medications. Now he would mostly have a "little petit mal" twitch on the side of his mouth, "almost like a stutter." This is how he feels prior to a seizure, he would take his medication and feel fine, but if he has a headache after and does not go to sleep, this would proceed to a GTC. He and his fiancee report the last GTC was 2.5 years ago. After a seizure he would stare off. Otherwise his fiancee has not seen any other staring/unresponsive episodes. He denies any myoclonic jerks in his extremities. He denies any olfactory/gustatory hallucinations, rising epigastric sensation. He denies any side effects on Depakote ER  2000mg  qAM. If he takes medication at night, he may have a headache. Otherwise he denies any headaches, dizziness, diplopia, focal numbness/tingling/weakness, bowel/bladder dysfunction. Over the past year, he and his fiancee have noted some short-term memory issues. He would not recall where he put things down 5 minutes earlier or a conversation he had the day prior. He denies getting lost driving, no missed bills. He takes his medication regularly, he has found that missing doses in combination with poor sleep and eating habits can trigger seizures. Last seizure 2.5 years ago occurred in this setting.    Epilepsy Risk Factors:  He was adopted and unaware of family history of seizures. As far as he knows, he had a normal birth and early development.  There is no history of febrile convulsions, CNS infections such as meningitis/encephalitis, significant traumatic brain injury, neurosurgical procedures.    PAST MEDICAL HISTORY: Past Medical History:  Diagnosis Date   Asthma    in childhood, no asthma problems since age 56    Epilepsy (HCC) 1993   Seizure (HCC)     MEDICATIONS: Current Outpatient Medications on File Prior to Visit  Medication Sig Dispense Refill   acetaminophen (TYLENOL) 325 MG tablet Take 650 mg by mouth every 6 (six) hours as needed.     amoxicillin (AMOXIL) 500 MG capsule Take 1 capsule (500 mg total) by mouth every 8 (eight) hours. 21 capsule 0   chlorhexidine (PERIDEX) 0.12 % solution Swish for one minute and expectorate twice daily for 10 days. 473 mL 0   divalproex (DEPAKOTE ER) 500 MG 24 hr tablet Take 4 tablets (2,000  mg total) by mouth daily. 120 tablet 5   HYDROcodone-acetaminophen (NORCO) 5-325 MG tablet Take 1-2 tablets by mouth every 6 (six) hours as needed. 15 tablet 0   ibuprofen (ADVIL) 800 MG tablet Take 1 tablet every 8 hours as needed for pain 20 tablet 0   ibuprofen (ADVIL,MOTRIN) 600 MG tablet Take 1 tablet (600 mg total) by mouth every 6 (six) hours as  needed. 30 tablet 0   ketoconazole (NIZORAL) 2 % cream Apply 1 Application topically daily. 15 g 0   predniSONE (DELTASONE) 10 MG tablet Take 2 tablets (20 mg total) by mouth 2 (two) times daily with a meal. 20 tablet 0   No current facility-administered medications on file prior to visit.    ALLERGIES: Allergies  Allergen Reactions   Tegretol [Carbamazepine] Other (See Comments)    Excessive weight gain    FAMILY HISTORY: Family History  Adopted: Yes  Family history unknown: Yes    SOCIAL HISTORY: Social History   Socioeconomic History   Marital status: Single    Spouse name: Not on file   Number of children: 1    Years of education: some colle   Highest education level: Not on file  Occupational History    Employer: [employer]  Tobacco Use   Smoking status: Some Days    Packs/day: 0.25    Years: 5.00    Additional pack years: 0.00    Total pack years: 1.25    Types: Cigarettes   Smokeless tobacco: Never  Vaping Use   Vaping Use: Never used  Substance and Sexual Activity   Alcohol use: Yes    Alcohol/week: 0.0 standard drinks of alcohol    Comment: socially, evevey couple of weekes    Drug use: No   Sexual activity: Yes    Birth control/protection: None  Other Topics Concern   Not on file  Social History Narrative    Pt lives in 2 story home with girlfriend and roommate   Has 1 daughter   Some college education   Works as a DJ   Right handed    Social Determinants of Corporate investment banker Strain: Not on file  Food Insecurity: Not on file  Transportation Needs: Not on file  Physical Activity: Not on file  Stress: Not on file  Social Connections: Not on file  Intimate Partner Violence: Not on file     PHYSICAL EXAM: Vitals:   04/18/23 1459 04/18/23 1500  BP: (!) 146/76 135/71  Pulse: 82   SpO2: 99%    General: No acute distress Head:  Normocephalic/atraumatic Skin/Extremities: No rash, no edema Neurological Exam: alert and awake. No  aphasia or dysarthria. Fund of knowledge is appropriate.  Attention and concentration are normal.   Cranial nerves: Pupils equal, round. Extraocular movements intact with no nystagmus. Visual fields full.  No facial asymmetry.  Motor: Bulk and tone normal, muscle strength 5/5 throughout with no pronator drift.   Finger to nose testing intact.  Gait narrow-based and steady, able to tandem walk adequately.  Romberg negative.   IMPRESSION: This is a pleasant 37 yo RH man with a history of seizures with primary generalized epilepsy. He reports twitching on the side of his mouth/"almost a stutter" and GTCs. 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 He continues to do well with no convulsions since 09/2018 on Depakote ER 500mg  4 tablets daily (2000mg  daily), refills  sent. We again discussed avoidance of seizure triggers. He is aware of [place] driving laws to stop driving after a seizure until 6 months seizure-free. Follow-up in 1 year, call for any changes.    Thank you for allowing me to participate in his care.  Please do not hesitate to call for any questions or concerns.   Patrcia Dolly, M.D.   CC: Bertram Denver, NP

## 2023-04-18 NOTE — Patient Instructions (Signed)
Good to see you doing well. Refills sent for Depakote ER 500mg : take 4 tablets every morning. Follow-up in 1 year, call for any changes.    Seizure Precautions: 1. If medication has been prescribed for you to prevent seizures, take it exactly as directed.  Do not stop taking the medicine without talking to your doctor first, even if you have not had a seizure in a long time.   2. Avoid activities in which a seizure would cause danger to yourself or to others.  Don't operate dangerous machinery, swim alone, or climb in high or dangerous places, such as on ladders, roofs, or girders.  Do not drive unless your doctor says you may.  3. If you have any warning that you may have a seizure, lay down in a safe place where you can't hurt yourself.    4.  No driving for 6 months from last seizure, as per [facility].   Please refer to the following link on the Epilepsy Foundation of America's website for more information: http://www.epilepsyfoundation.org/answerplace/Social/driving/drivingu.cfm   5.  Maintain good sleep hygiene. Avoid alcohol.  6.  Contact your doctor if you have any problems that may be related to the medicine you are taking.  7.  Call 911 and bring the patient back to the ED if:        A.  The seizure lasts longer than 5 minutes.       B.  The patient doesn't awaken shortly after the seizure  C.  The patient has new problems such as difficulty seeing, speaking or moving  D.  The patient was injured during the seizure  E.  The patient has a temperature over 102 F (39C)  F.  The patient vomited and now is having trouble breathing

## 2023-04-24 ENCOUNTER — Other Ambulatory Visit: Payer: Self-pay

## 2023-04-30 ENCOUNTER — Other Ambulatory Visit: Payer: Self-pay

## 2023-06-13 ENCOUNTER — Other Ambulatory Visit: Payer: Self-pay

## 2023-07-23 ENCOUNTER — Other Ambulatory Visit: Payer: Self-pay

## 2023-07-26 ENCOUNTER — Other Ambulatory Visit: Payer: Self-pay

## 2023-07-26 ENCOUNTER — Ambulatory Visit: Payer: Self-pay

## 2023-07-26 NOTE — Telephone Encounter (Signed)
Noted  

## 2023-07-26 NOTE — Telephone Encounter (Signed)
Chief Complaint: SOB Symptoms: Moderate SOB, chest tightness, back pain Frequency: constant Pertinent Negatives: Patient denies wheezing, cold symptoms Disposition: [] ED /[x] Urgent Care (no appt availability in office) / [] Appointment(In office/virtual)/ []  [facility]/ [] Home Care/ [] Refused Recommended Disposition /[] [facility]/ []  Follow-up with PCP Additional Notes: Patient states he ate a piece of candy a few hours ago and it feels like it is stuck in his throat causing moderate SOB, chest tightness and back pain with movement. Patient stated he tried to cough it up but still feels like it is in there. Patient states he can talk clearly and walk fine but his throat just feels irritated. Care advice given, no appointments available in office, and offered patient an urgent care appointment patient declined and stated he will go to one on [address] as a walk-in. Advised patient to callback with any concerns or if symptoms get worse. Patient verbalized understanding.   Reason for Disposition  [1] MILD difficulty breathing (e.g., minimal/no SOB at rest, SOB with walking, pulse <100) AND [2] NEW-onset or WORSE than normal  Answer Assessment - Initial Assessment Questions 1. RESPIRATORY STATUS: "Describe your breathing?" (e.g., wheezing, shortness of breath, unable to speak, severe coughing)      SOB 2. ONSET: "When did this breathing problem begin?"      Today a few hours ago  3. PATTERN "Does the difficult breathing come and go, or has it been constant since it started?"      Constant 4. SEVERITY: "How bad is your breathing?" (e.g., mild, moderate, severe)    - MILD: No SOB at rest, mild SOB with walking, speaks normally in sentences, can lie down, no retractions, pulse < 100.    - MODERATE: SOB at rest, SOB with minimal exertion and prefers to sit, cannot lie down flat, speaks in phrases, mild retractions, audible wheezing, pulse 100-120.    - SEVERE: Very SOB at  rest, speaks in single words, struggling to breathe, sitting hunched forward, retractions, pulse > 120      Moderated  5. RECURRENT SYMPTOM: "Have you had difficulty breathing before?" If Yes, ask: "When was the last time?" and "What happened that time?"      No 6. CARDIAC HISTORY: "Do you have any history of heart disease?" (e.g., heart attack, angina, bypass surgery, angioplasty)      No  7. LUNG HISTORY: "Do you have any history of lung disease?"  (e.g., pulmonary embolus, asthma, emphysema)     No 8. CAUSE: "What do you think is causing the breathing problem?"      Swallowed a piece of candy feels like it is stuck in my throat 9. OTHER SYMPTOMS: "Do you have any other symptoms? (e.g., dizziness, runny nose, cough, chest pain, fever)     Chest discomfort and back pain when moving  Protocols used: Breathing Difficulty-A-AH

## 2023-08-30 ENCOUNTER — Emergency Department (HOSPITAL_BASED_OUTPATIENT_CLINIC_OR_DEPARTMENT_OTHER)
Admission: EM | Admit: 2023-08-30 | Discharge: 2023-08-30 | Disposition: A | Discharge status: Home / Self Care | Payer: Self-pay | Attending: Emergency Medicine | Admitting: Emergency Medicine

## 2023-08-30 ENCOUNTER — Encounter (HOSPITAL_BASED_OUTPATIENT_CLINIC_OR_DEPARTMENT_OTHER): Payer: Self-pay | Admitting: Emergency Medicine

## 2023-08-30 ENCOUNTER — Other Ambulatory Visit: Payer: Self-pay

## 2023-08-30 DIAGNOSIS — R112 Nausea with vomiting, unspecified: Secondary | ICD-10-CM | POA: Insufficient documentation

## 2023-08-30 DIAGNOSIS — Z20822 Contact with and (suspected) exposure to covid-19: Secondary | ICD-10-CM | POA: Insufficient documentation

## 2023-08-30 DIAGNOSIS — F17200 Nicotine dependence, unspecified, uncomplicated: Secondary | ICD-10-CM | POA: Insufficient documentation

## 2023-08-30 DIAGNOSIS — R519 Headache, unspecified: Secondary | ICD-10-CM | POA: Insufficient documentation

## 2023-08-30 LAB — RESP PANEL BY RT-PCR (RSV, FLU A&B, COVID)  RVPGX2
Influenza A by PCR: NEGATIVE
Influenza B by PCR: NEGATIVE
Resp Syncytial Virus by PCR: NEGATIVE
SARS Coronavirus 2 by RT PCR: NEGATIVE

## 2023-08-30 MED ORDER — ACETAMINOPHEN 500 MG PO TABS
1000.0000 mg | ORAL_TABLET | Freq: Once | ORAL | Status: AC
  Administered 2023-08-30: 1000 mg via ORAL
  Filled 2023-08-30: qty 2

## 2023-08-30 MED ORDER — ONDANSETRON 4 MG PO TBDP
4.0000 mg | ORAL_TABLET | Freq: Once | ORAL | Status: AC
  Administered 2023-08-30: 4 mg via ORAL
  Filled 2023-08-30: qty 1

## 2023-08-30 NOTE — ED Provider Notes (Addendum)
[place] EMERGENCY DEPARTMENT AT [facility] Provider Note   CSN: [id number] Arrival date & time: 08/30/23  4098     History  Chief Complaint  Patient presents with   Emesis    Donald Travis is a 37 y.o. male.  Patient with acute onset of headache and 1 episode of vomiting.  Still feeling little bit nauseated.  Patient has 2 daughters at home with the flu.  Patient states that his work needs to know whether he has flu or COVID.  However patient does not feel well enough to work today.  There was no blood in the vomit.  Patient does have a history of up epilepsy does take Depakote extended release.  No change in the amounts.  The headache is all over.  No neck stiffness with it.  No chest pain no shortness of breath no abdominal pain denies any fever or chills.  Had a little bit of a cough but no congestion.  Past medical history is mention history of epilepsy asthma.  Patient smokes some days.  In addition patient denies any myalgias or sore throat.  Patient did take Motrin earlier this morning.  With only minimal relief of the headache.       Home Medications Prior to Admission medications   Medication Sig Start Date End Date Taking? Authorizing Provider  acetaminophen (TYLENOL) 325 MG tablet Take 650 mg by mouth every 6 (six) hours as needed.    [provider]  divalproex (DEPAKOTE ER) 500 MG 24 hr tablet Take 4 tablets (2,000 mg total) by mouth daily. 04/18/23   Van Clines, MD  ibuprofen (ADVIL,MOTRIN) 600 MG tablet Take 1 tablet (600 mg total) by mouth every 6 (six) hours as needed. 01/10/17   Fayrene Helper, PA-C      Allergies    Tegretol [carbamazepine]    Review of Systems   Review of Systems  Constitutional:  Negative for chills and fever.  HENT:  Negative for congestion, rhinorrhea and sore throat.   Eyes:  Negative for visual disturbance.  Respiratory:  Positive for cough. Negative for shortness of breath.   Cardiovascular:  Negative for chest  pain and leg swelling.  Gastrointestinal:  Positive for nausea and vomiting. Negative for abdominal pain and diarrhea.  Genitourinary:  Negative for dysuria.  Musculoskeletal:  Negative for back pain and neck pain.  Skin:  Negative for rash.  Neurological:  Positive for headaches. Negative for dizziness and light-headedness.  Hematological:  Does not bruise/bleed easily.  Psychiatric/Behavioral:  Negative for confusion.     Physical Exam Updated Vital Signs BP (!) 135/91 (BP Location: Right Arm)   Pulse 84   Temp 98.4 F (36.9 C) (Oral)   Resp 16   Ht 1.753 m (5\' 9" )   Wt 72.6 kg   SpO2 98%   BMI 23.63 kg/m  Physical Exam Vitals and nursing note reviewed.  Constitutional:      General: He is not in acute distress.    Appearance: Normal appearance. He is well-developed. He is not ill-appearing.  HENT:     Head: Normocephalic and atraumatic.     Mouth/Throat:     Mouth: Mucous membranes are moist.  Eyes:     Extraocular Movements: Extraocular movements intact.     Conjunctiva/sclera: Conjunctivae normal.     Pupils: Pupils are equal, round, and reactive to light.  Cardiovascular:     Rate and Rhythm: Normal rate and regular rhythm.     Heart sounds: No murmur  heard. Pulmonary:     Effort: Pulmonary effort is normal. No respiratory distress.     Breath sounds: Normal breath sounds. No stridor. No wheezing, rhonchi or rales.  Abdominal:     General: There is no distension.     Palpations: Abdomen is soft.     Tenderness: There is no abdominal tenderness. There is no guarding.  Musculoskeletal:        General: No swelling.     Cervical back: Normal range of motion and neck supple. No rigidity.     Right lower leg: No edema.     Left lower leg: No edema.  Skin:    General: Skin is warm and dry.     Capillary Refill: Capillary refill takes less than 2 seconds.  Neurological:     General: No focal deficit present.     Mental Status: He is alert and oriented to person,  place, and time.  Psychiatric:        Mood and Affect: Mood normal.     ED Results / Procedures / Treatments   Labs (all labs ordered are listed, but only abnormal results are displayed) Labs Reviewed  RESP PANEL BY RT-PCR (RSV, FLU A&B, COVID)  [id number]    EKG None  Radiology No results found.  Procedures Procedures    Medications Ordered in ED Medications  ondansetron (ZOFRAN-ODT) disintegrating tablet 4 mg (has no administration in time range)  acetaminophen (TYLENOL) tablet 1,000 mg (has no administration in time range)    ED Course/ Medical Decision Making/ A&P                                 Medical Decision Making Risk OTC drugs. Prescription drug management.   Respiratory panel obtained by nursing.  And is pending.  Patient nontoxic no acute distress  Either way patient does not feel well enough to work today.  So work note will be provided for today.  Will give here Zofran ODT and will give extra strength Tylenol.  Patient's symptoms of headache nausea and vomiting can be due to to the Depakote however patient's been on this dose for a long period of time and has not had symptoms like this.  Patient's respiratory panel is negative no evidence of COVID influenza or RSV infection.  Patient does not want any Zofran for the nausea.  Work note for today.  Patient will return for any new or worse symptoms.  Patient precautions that his Depakote can cause the symptoms.  So if they persist it could be secondary to side effects from that.  Or from the level being too high.   Final Clinical Impression(s) / ED Diagnoses Final diagnoses:  Nausea and vomiting, unspecified vomiting type  Acute intractable headache, unspecified headache type    Rx / DC Orders ED Discharge Orders     None         Vanetta Mulders, MD 08/30/23 1005    Vanetta Mulders, MD 08/30/23 1040    Vanetta Mulders, MD 08/30/23 1046

## 2023-08-30 NOTE — ED Triage Notes (Signed)
Pt arrived POV, caox4, ambulatory, NAD c/o headache and one episode of vomiting this morning, further stating "both of his daughters had the flu last week and his job won't let him go back until he is tested." Pt denies CP, SOB, fever/chills, N/V at present.

## 2023-08-30 NOTE — Discharge Instructions (Signed)
Respiratory panel negative for COVID flu and RSV.  Work note provided.  Return for any new or worse symptoms.

## 2023-08-30 NOTE — ED Notes (Signed)
Discharge instructions and follow up care reviewed and explained, pt verbalized understanding and had no further questions on d/c. Pt caox4, ambulatory, NAD on d/c. 

## 2023-09-10 ENCOUNTER — Other Ambulatory Visit: Payer: Self-pay

## 2023-09-11 ENCOUNTER — Other Ambulatory Visit: Payer: Self-pay

## 2023-10-24 ENCOUNTER — Other Ambulatory Visit: Payer: Self-pay

## 2023-12-06 ENCOUNTER — Other Ambulatory Visit: Payer: Self-pay

## 2023-12-06 ENCOUNTER — Encounter (HOSPITAL_BASED_OUTPATIENT_CLINIC_OR_DEPARTMENT_OTHER): Payer: Self-pay | Admitting: Emergency Medicine

## 2023-12-06 ENCOUNTER — Emergency Department (HOSPITAL_BASED_OUTPATIENT_CLINIC_OR_DEPARTMENT_OTHER)
Admission: EM | Admit: 2023-12-06 | Discharge: 2023-12-06 | Disposition: A | Discharge status: Home / Self Care | Payer: Self-pay | Attending: Emergency Medicine | Admitting: Emergency Medicine

## 2023-12-06 DIAGNOSIS — Z20822 Contact with and (suspected) exposure to covid-19: Secondary | ICD-10-CM | POA: Insufficient documentation

## 2023-12-06 DIAGNOSIS — J069 Acute upper respiratory infection, unspecified: Secondary | ICD-10-CM | POA: Insufficient documentation

## 2023-12-06 DIAGNOSIS — J45909 Unspecified asthma, uncomplicated: Secondary | ICD-10-CM | POA: Insufficient documentation

## 2023-12-06 LAB — RESP PANEL BY RT-PCR (RSV, FLU A&B, COVID)  RVPGX2
Influenza A by PCR: NEGATIVE
Influenza B by PCR: NEGATIVE
Resp Syncytial Virus by PCR: NEGATIVE
SARS Coronavirus 2 by RT PCR: NEGATIVE

## 2023-12-06 MED ORDER — ALBUTEROL SULFATE HFA 108 (90 BASE) MCG/ACT IN AERS
1.0000 | INHALATION_SPRAY | Freq: Four times a day (QID) | RESPIRATORY_TRACT | 0 refills | Status: DC | PRN
  Filled 2023-12-06: qty 18, 25d supply, fill #0

## 2023-12-06 NOTE — ED Notes (Signed)
Pt given discharge instructions and reviewed prescriptions. Opportunities given for questions. Pt verbalizes understanding. Jillyn Hidden, RN

## 2023-12-06 NOTE — Discharge Instructions (Signed)
Today you are seen for URI.  Please pick up your albuterol inhaler and take as needed for wheezing.  You may also take over-the-counter Mucinex for chest congestion and cough medicine for cough.  Thank you for letting us treat you today. After performing physical exam and reviewing your labs, I feel you are safe to go home. Please follow up with your PCP in the next several days and provide them with your records from this visit. Return to the Emergency Room if pain becomes severe or symptoms worsen.

## 2023-12-06 NOTE — ED Provider Notes (Signed)
[place] EMERGENCY DEPARTMENT AT [facility] Provider Note   CSN: [id number] Arrival date & time: 12/06/23  1347     History  Chief Complaint  Patient presents with   Cough    Donald Travis is a 38 y.o. male medical history of asthma presents today for cough that began this morning.  Patient also endorses congestion and chest soreness with coughing.  Patient denies nausea, vomiting, chest pain, shortness of breath, fever, chills, body aches.  Patient is concerned for influenza infection as he works with a small team and does not want to get them infected.   Cough      Home Medications Prior to Admission medications   Medication Sig Start Date End Date Taking? Authorizing Provider  albuterol (VENTOLIN HFA) 108 (90 Base) MCG/ACT inhaler Inhale 1-2 puffs into the lungs every 6 (six) hours as needed for wheezing or shortness of breath. 12/06/23  Yes Dolphus Jenny, PA-C  acetaminophen (TYLENOL) 325 MG tablet Take 650 mg by mouth every 6 (six) hours as needed.    [provider]  divalproex (DEPAKOTE ER) 500 MG 24 hr tablet Take 4 tablets (2,000 mg total) by mouth daily. 04/18/23   Van Clines, MD  ibuprofen (ADVIL,MOTRIN) 600 MG tablet Take 1 tablet (600 mg total) by mouth every 6 (six) hours as needed. 01/10/17   Fayrene Helper, PA-C      Allergies    Tegretol [carbamazepine]    Review of Systems   Review of Systems  HENT:  Positive for congestion.   Respiratory:  Positive for cough.     Physical Exam Updated Vital Signs BP 130/87 (BP Location: Right Arm)   Pulse 67   Temp 98.5 F (36.9 C)   Resp 17   SpO2 99%  Physical Exam Vitals and nursing note reviewed.  Constitutional:      General: He is not in acute distress.    Appearance: Normal appearance. He is well-developed. He is not ill-appearing.  HENT:     Head: Normocephalic and atraumatic.     Right Ear: External ear normal.     Left Ear: External ear normal.     Nose: Nose normal.      Mouth/Throat:     Mouth: Mucous membranes are moist.     Pharynx: Oropharynx is clear. No oropharyngeal exudate or posterior oropharyngeal erythema.  Eyes:     Extraocular Movements: Extraocular movements intact.     Conjunctiva/sclera: Conjunctivae normal.  Cardiovascular:     Rate and Rhythm: Normal rate and regular rhythm.     Pulses: Normal pulses.     Heart sounds: Normal heart sounds. No murmur heard. Pulmonary:     Effort: Pulmonary effort is normal. No respiratory distress.     Breath sounds: Normal breath sounds. No wheezing.  Abdominal:     Palpations: Abdomen is soft.     Tenderness: There is no abdominal tenderness.  Musculoskeletal:        General: No swelling.     Cervical back: Normal range of motion and neck supple.  Skin:    General: Skin is warm and dry.     Capillary Refill: Capillary refill takes less than 2 seconds.  Neurological:     General: No focal deficit present.     Mental Status: He is alert.     Motor: No weakness.  Psychiatric:        Mood and Affect: Mood normal.     ED Results / Procedures / Treatments  Labs (all labs ordered are listed, but only abnormal results are displayed) Labs Reviewed  RESP PANEL BY RT-PCR (RSV, FLU A&B, COVID)  [id number]    EKG None  Radiology No results found.  Procedures Procedures    Medications Ordered in ED Medications - No data to display  ED Course/ Medical Decision Making/ A&P                                 Medical Decision Making  This patient presents to the ED with chief complaint(s) of cough with pertinent past medical history of asthma which further complicates the presenting complaint. The complaint involves an extensive differential diagnosis and also carries with it a high risk of complications and morbidity.    The differential diagnosis includes COVID, flu, RSV, URI  Additional history obtained: Records reviewed Primary Care Documents  ED Course and Reassessment:   Independent  labs interpretation:  The following labs were independently interpreted:  Respiratory panel: Negative  Consultation: - Consulted or discussed management/test interpretation w/ external professional: None  Consideration for admission or further workup: Consider for mission further workup however patient's vital signs, physical exam, and labs were reassuring.  Patient symptoms likely due to upper respiratory infection.  Patient given albuterol inhaler for past medical history of asthma and stating that he does not currently have one at home.  Patient also encouraged to use symptomatic management to include Mucinex and over-the-counter cough suppressant.  Patient to follow-up with primary care if the symptoms persist for further evaluation and treatment.        Final Clinical Impression(s) / ED Diagnoses Final diagnoses:  Upper respiratory tract infection, unspecified type    Rx / DC Orders ED Discharge Orders          Ordered    albuterol (VENTOLIN HFA) 108 (90 Base) MCG/ACT inhaler  Every 6 hours PRN        12/06/23 1609              Dolphus Jenny, PA-C 12/06/23 1611    Rondel Baton, MD 12/10/23 [phone]

## 2023-12-06 NOTE — ED Triage Notes (Signed)
Pt reports cough and pain with coughing that started this morning. Denies n/v.  Denies any chest pain without a large cough.  Pt states he needs to get checked for flu as he works with a small team and doesn't want to infect anyone else if he has it.   Pt AAOx4, NAD in triage. Jogged across waiting area to triage room with no SOB or distress.

## 2023-12-07 ENCOUNTER — Other Ambulatory Visit: Payer: Self-pay

## 2024-01-18 ENCOUNTER — Other Ambulatory Visit: Payer: Self-pay

## 2024-01-29 ENCOUNTER — Other Ambulatory Visit (HOSPITAL_COMMUNITY): Payer: Self-pay

## 2024-01-29 MED ORDER — HYDROCODONE-ACETAMINOPHEN 5-325 MG PO TABS
1.0000 | ORAL_TABLET | Freq: Four times a day (QID) | ORAL | 0 refills | Status: DC | PRN
  Filled 2024-01-29: qty 20, 5d supply, fill #0

## 2024-01-29 MED ORDER — DOXYCYCLINE MONOHYDRATE 100 MG PO CAPS
100.0000 mg | ORAL_CAPSULE | Freq: Two times a day (BID) | ORAL | 0 refills | Status: DC
Start: 2024-01-29 — End: 2024-08-04
  Filled 2024-01-29: qty 14, 7d supply, fill #0

## 2024-01-29 MED ORDER — AMOXICILLIN-POT CLAVULANATE 875-125 MG PO TABS
1.0000 | ORAL_TABLET | Freq: Two times a day (BID) | ORAL | 0 refills | Status: DC
  Filled 2024-01-29: qty 14, 7d supply, fill #0

## 2024-02-21 ENCOUNTER — Other Ambulatory Visit: Payer: Self-pay

## 2024-02-22 ENCOUNTER — Other Ambulatory Visit: Payer: Self-pay

## 2024-04-01 ENCOUNTER — Ambulatory Visit: Payer: Self-pay | Admitting: Neurology

## 2024-04-03 ENCOUNTER — Other Ambulatory Visit: Payer: Self-pay

## 2024-05-08 ENCOUNTER — Other Ambulatory Visit: Payer: Self-pay

## 2024-05-08 ENCOUNTER — Other Ambulatory Visit: Payer: Self-pay | Admitting: Neurology

## 2024-05-08 DIAGNOSIS — G40309 Generalized idiopathic epilepsy and epileptic syndromes, not intractable, without status epilepticus: Secondary | ICD-10-CM

## 2024-05-08 MED ORDER — DIVALPROEX SODIUM ER 500 MG PO TB24
2000.0000 mg | ORAL_TABLET | Freq: Every day | ORAL | 11 refills | Status: DC
  Filled 2024-05-08: qty 120, 30d supply, fill #0
  Filled 2024-06-20: qty 120, 30d supply, fill #1
  Filled 2024-08-01: qty 120, 30d supply, fill #2

## 2024-05-08 NOTE — Telephone Encounter (Signed)
 Pt left a voice mail needing the refill he did not leave the name of the medication

## 2024-06-03 ENCOUNTER — Telehealth: Payer: Self-pay | Admitting: Neurology

## 2024-06-03 ENCOUNTER — Other Ambulatory Visit: Payer: Self-pay | Admitting: *Deleted

## 2024-06-03 ENCOUNTER — Ambulatory Visit: Payer: Self-pay | Admitting: Neurology

## 2024-06-03 NOTE — Telephone Encounter (Signed)
 Notified pt --still have refill rx depakote  ER 500mg  and to call pharmacy-pt voiced understanding.

## 2024-06-03 NOTE — Telephone Encounter (Signed)
 Pt came in late today for appointment. Needing  divalproex  (DEPAKOTE  ER) 500 MG 24 hr tablet . Pt reschedule to 08-04-24. Pt wants he medication sent to [facility]

## 2024-06-20 ENCOUNTER — Other Ambulatory Visit: Payer: Self-pay

## 2024-08-01 ENCOUNTER — Other Ambulatory Visit: Payer: Self-pay

## 2024-08-04 ENCOUNTER — Other Ambulatory Visit: Payer: Self-pay

## 2024-08-04 ENCOUNTER — Ambulatory Visit: Payer: Self-pay | Admitting: Neurology

## 2024-08-04 ENCOUNTER — Encounter: Payer: Self-pay | Admitting: Neurology

## 2024-08-04 DIAGNOSIS — G40309 Generalized idiopathic epilepsy and epileptic syndromes, not intractable, without status epilepticus: Secondary | ICD-10-CM

## 2024-08-04 MED ORDER — DIVALPROEX SODIUM ER 500 MG PO TB24
2000.0000 mg | ORAL_TABLET | Freq: Every day | ORAL | 12 refills | Status: AC
  Filled 2024-08-04 – 2024-09-08 (×2): qty 120, 30d supply, fill #0
  Filled 2024-10-22 (×2): qty 120, 30d supply, fill #1
  Filled 2024-11-27 – 2024-11-28 (×2): qty 120, 30d supply, fill #2

## 2024-08-04 NOTE — Progress Notes (Signed)
 NEUROLOGY FOLLOW UP OFFICE NOTE  Donald Travis [id number] [birth year]  HISTORY OF PRESENT ILLNESS: I had the pleasure of seeing Donald Travis in follow-up in the neurology clinic on 08/04/2024.  The patient was last seen over a year ago for well-controlled Primary Generalized Epilepsy. He is alone in the office today. Records and images were personally reviewed where available.  Since his last visit, he continues to do well seizure-free since 09/2018 on Depakote  ER 2000mg  daily (500mg : 4 tab daily) without side effects. He denies any staring/unresponsive episodes, gaps in time, olfactory/gustatory hallucinations, focal numbness/tingling/weakness, myoclonic jerks. No headaches, dizziness, vision changes, no falls. He gets 6-8 hours of sleep. Mood is calm. He lives with his parents. He works as a Financial risk analyst.    History on Initial Assessment 03/11/2018: This is a pleasant 38 year old right-handed man with a history of seizures since the 3rd grade, presenting to establish care. He reports hitting his head on the gym floor in the 3rd grade, and seizures starting since then. He was mostly having generalized convulsions and recalls trying different medications until he was started on Depakote  in high school with good response. He recalls taking Tegretol at some point, does not recall the other medications. Now he would mostly have a little petit mal twitch on the side of his mouth, almost like a stutter. This is how he feels prior to a seizure, he would take his medication and feel fine, but if he has a headache after and does not go to sleep, this would proceed to a GTC. He and his fiancee report the last GTC was 2.5 years ago. After a seizure he would stare off. Otherwise his fiancee has not seen any other staring/unresponsive episodes. He denies any myoclonic jerks in his extremities. He denies any olfactory/gustatory hallucinations, rising epigastric sensation. He denies any side effects on Depakote  ER 2000mg   qAM. If he takes medication at night, he may have a headache. Otherwise he denies any headaches, dizziness, diplopia, focal numbness/tingling/weakness, bowel/bladder dysfunction. Over the past year, he and his fiancee have noted some short-term memory issues. He would not recall where he put things down 5 minutes earlier or a conversation he had the day prior. He denies getting lost driving, no missed bills. He takes his medication regularly, he has found that missing doses in combination with poor sleep and eating habits can trigger seizures. Last seizure 2.5 years ago occurred in this setting.    Epilepsy Risk Factors:  He was adopted and unaware of family history of seizures. As far as he knows, he had a normal birth and early development.  There is no history of febrile convulsions, CNS infections such as meningitis/encephalitis, significant traumatic brain injury, neurosurgical procedures.    PAST MEDICAL HISTORY: Past Medical History:  Diagnosis Date   Asthma    in childhood, no asthma problems since age 22    Epilepsy (HCC) 1993   Seizure (HCC)     MEDICATIONS: Current Outpatient Medications on File Prior to Visit  Medication Sig Dispense Refill   acetaminophen  (TYLENOL ) 325 MG tablet Take 650 mg by mouth every 6 (six) hours as needed.     divalproex  (DEPAKOTE  ER) 500 MG 24 hr tablet Take 4 tablets (2,000 mg total) by mouth daily. 120 tablet 11   ibuprofen  (ADVIL ,MOTRIN ) 600 MG tablet Take 1 tablet (600 mg total) by mouth every 6 (six) hours as needed. 30 tablet 0   No current facility-administered medications on file prior to visit.  ALLERGIES: Allergies  Allergen Reactions   Tegretol [Carbamazepine] Other (See Comments)    Excessive weight gain    FAMILY HISTORY: Family History  Adopted: Yes  Family history unknown: Yes    SOCIAL HISTORY: Social History   Socioeconomic History   Marital status: Single    Spouse name: Not on file   Number of children: 1    Years  of education: some colle   Highest education level: Not on file  Occupational History    Employer: [employer]  Tobacco Use   Smoking status: Some Days    Current packs/day: 0.25    Average packs/day: 0.3 packs/day for 5.0 years (1.3 ttl pk-yrs)    Types: Cigarettes   Smokeless tobacco: Never  Vaping Use   Vaping status: Every Day  Substance and Sexual Activity   Alcohol use: Yes    Alcohol/week: 0.0 standard drinks of alcohol    Comment: socially, evevey couple of weekes    Drug use: No   Sexual activity: Yes    Birth control/protection: None  Other Topics Concern   Not on file  Social History Narrative    Pt lives in 2 story home with girlfriend and roommate   Has 1 daughter   Some college education   Works as a DJ   Right handed    Social Drivers of Corporate investment banker Strain: Not on file  Food Insecurity: Not on file  Transportation Needs: Not on file  Physical Activity: Not on file  Stress: Not on file  Social Connections: Unknown (03/06/2022)   Received from [employer]   Social Network    Social Network: Not on file  Intimate Partner Violence: Unknown (02/08/2022)   Received from [facility]   HITS    Physically Hurt: Not on file    Insult or Talk Down To: Not on file    Threaten Physical Harm: Not on file    Scream or Curse: Not on file     PHYSICAL EXAM: Vitals:   08/04/24 1443  BP: (!) 136/90  Pulse: 68  SpO2: 99%   General: No acute distress Head:  Normocephalic/atraumatic Skin/Extremities: No rash, no edema Neurological Exam: alert and awake. No aphasia or dysarthria. Fund of knowledge is appropriate.  Attention and concentration are normal.   Cranial nerves: Pupils equal, round. Extraocular movements intact with no nystagmus. Visual fields full.  No facial asymmetry.  Motor: Bulk and tone normal, muscle strength 5/5 throughout with no pronator drift.   Finger to nose testing intact.  Gait narrow-based and steady, able to tandem walk  adequately.  Romberg negative.   IMPRESSION: This is a pleasant 38 yo RH man with a history of seizures with primary generalized epilepsy. He reports twitching on the side of his mouth/almost a stutter and GTCs. 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 He has been seizure-free since 09/2018 on Depakote  ER 2000mg  daily, refills sent. He has not seen his PCP in a while, safety labs will be ordered (CBC, CMP). He is aware of [place] driving laws to stop driving after a seizure until 6 months seizure-free. Follow-up in 1 year, call for any changes.    Thank you for allowing me to participate in his care.  Please do not hesitate to call for any questions or concerns.    Darice Shivers, M.D.   CC: Haze Servant, NP

## 2024-08-04 NOTE — Patient Instructions (Signed)
 Good to see you!   Have bloodwork done for CBC, CMP  2. Continue Depakote  ER 500mg : take 4 tablets every morning  3. Follow-up in 1 year, call for any changes   Seizure Precautions: 1. If medication has been prescribed for you to prevent seizures, take it exactly as directed.  Do not stop taking the medicine without talking to your doctor first, even if you have not had a seizure in a long time.   2. Avoid activities in which a seizure would cause danger to yourself or to others.  Don't operate dangerous machinery, swim alone, or climb in high or dangerous places, such as on ladders, roofs, or girders.  Do not drive unless your doctor says you may.  3. If you have any warning that you may have a seizure, lay down in a safe place where you can't hurt yourself.    4.  No driving for 6 months from last seizure, as per [place]  state law.   Please refer to the following link on the Epile[employer] Foundation of America's website for more information: http://www.epilepsyfoundation.org/answerplace/Social/driving/drivingu.cfm   5.  Maintain good sleep hygiene. Avoid alcohol.  6.  Contact your doctor if you have any problems that may be related to the medicine you are taking.  7.  Call 911 and bring the patient back to the ED if:        A.  The seizure lasts longer than 5 minutes.       B.  The patient doesn't awaken shortly after the seizure  C.  The patient has new problems such as difficulty seeing, speaking or moving  D.  The patient was injured during the seizure  E.  The patient has a temperature over 102 F (39C)  F.  The patient vomited and now is having trouble breathing

## 2024-09-08 ENCOUNTER — Other Ambulatory Visit: Payer: Self-pay

## 2024-09-24 ENCOUNTER — Encounter: Payer: Self-pay | Admitting: Neurology

## 2024-10-22 ENCOUNTER — Other Ambulatory Visit: Payer: Self-pay

## 2024-10-23 ENCOUNTER — Other Ambulatory Visit: Payer: Self-pay

## 2024-11-27 ENCOUNTER — Other Ambulatory Visit: Payer: Self-pay

## 2024-11-28 ENCOUNTER — Other Ambulatory Visit: Payer: Self-pay

## 2025-08-05 ENCOUNTER — Ambulatory Visit: Payer: Self-pay | Admitting: Neurology
